# Patient Record
Sex: Female | Born: 1970 | Race: White | Hispanic: No | Marital: Single | Smoking: Never smoker
Health system: Southern US, Community
[De-identification: ages and names within clinical notes are randomized; demographics above are authoritative.]

## PROBLEM LIST (undated history)

## (undated) DIAGNOSIS — M67439 Ganglion, unspecified wrist: Secondary | ICD-10-CM

## (undated) DIAGNOSIS — M1712 Unilateral primary osteoarthritis, left knee: Secondary | ICD-10-CM

## (undated) DIAGNOSIS — N200 Calculus of kidney: Secondary | ICD-10-CM

## (undated) DIAGNOSIS — M7711 Lateral epicondylitis, right elbow: Secondary | ICD-10-CM

## (undated) DIAGNOSIS — Z9889 Other specified postprocedural states: Secondary | ICD-10-CM

## (undated) DIAGNOSIS — M1990 Unspecified osteoarthritis, unspecified site: Secondary | ICD-10-CM

## (undated) DIAGNOSIS — M25519 Pain in unspecified shoulder: Secondary | ICD-10-CM

## (undated) HISTORY — PX: KNEE ARTHROSCOPY: SUR90

## (undated) HISTORY — DX: Calculus of kidney: N20.0

## (undated) HISTORY — PX: JOINT REPLACEMENT: SHX530

## (undated) HISTORY — DX: Unilateral primary osteoarthritis, left knee: M17.12

---

## 2000-12-29 ENCOUNTER — Other Ambulatory Visit: Admission: RE | Admit: 2000-12-29 | Discharge: 2000-12-29 | Payer: Self-pay | Admitting: *Deleted

## 2002-02-25 ENCOUNTER — Other Ambulatory Visit: Admission: RE | Admit: 2002-02-25 | Discharge: 2002-02-25 | Payer: Self-pay | Admitting: Family Medicine

## 2002-04-13 ENCOUNTER — Encounter: Payer: Self-pay | Admitting: Family Medicine

## 2002-04-13 ENCOUNTER — Encounter: Admission: RE | Admit: 2002-04-13 | Discharge: 2002-04-13 | Payer: Self-pay | Admitting: Family Medicine

## 2004-02-21 ENCOUNTER — Other Ambulatory Visit: Admission: RE | Admit: 2004-02-21 | Discharge: 2004-02-21 | Payer: Self-pay | Admitting: Obstetrics and Gynecology

## 2010-09-14 ENCOUNTER — Other Ambulatory Visit: Admission: RE | Admit: 2010-09-14 | Discharge: 2010-09-14 | Payer: Self-pay | Admitting: Family Medicine

## 2010-09-28 ENCOUNTER — Encounter: Admission: RE | Admit: 2010-09-28 | Discharge: 2010-09-28 | Payer: Self-pay | Admitting: Family Medicine

## 2010-10-12 ENCOUNTER — Encounter
Admission: RE | Admit: 2010-10-12 | Discharge: 2010-10-12 | Payer: Self-pay | Source: Home / Self Care | Attending: Family Medicine | Admitting: Family Medicine

## 2010-11-18 ENCOUNTER — Encounter: Payer: Self-pay | Admitting: Family Medicine

## 2011-10-01 ENCOUNTER — Other Ambulatory Visit: Payer: Self-pay | Admitting: Family Medicine

## 2011-10-01 DIAGNOSIS — Z1231 Encounter for screening mammogram for malignant neoplasm of breast: Secondary | ICD-10-CM

## 2011-10-10 ENCOUNTER — Ambulatory Visit
Admission: RE | Admit: 2011-10-10 | Discharge: 2011-10-10 | Disposition: A | Discharge status: Home / Self Care | Payer: BC Managed Care – PPO | Source: Ambulatory Visit | Attending: Family Medicine | Admitting: Family Medicine

## 2011-10-10 DIAGNOSIS — Z1231 Encounter for screening mammogram for malignant neoplasm of breast: Secondary | ICD-10-CM

## 2012-08-25 ENCOUNTER — Encounter (HOSPITAL_BASED_OUTPATIENT_CLINIC_OR_DEPARTMENT_OTHER): Payer: Self-pay | Admitting: *Deleted

## 2012-08-25 ENCOUNTER — Other Ambulatory Visit: Payer: Self-pay | Admitting: Orthopedic Surgery

## 2012-08-26 NOTE — H&P (Signed)
  Rebecca Morrow is an 41 y.o. female.   Chief Complaint: c/o mass dorsum left wrist HPI: Rebecca Morrow presented for evaluation of a left dorsal distal forearm cyst overlying the extensor carpi ulnaris. This has been present for two years.  It has gradually increased in size.  She has had prior injections without relief.  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  She has no antecedent history of injury in this region.  She has no history of rheumatoid arthritis or other significant medical impairments.      Past Medical History  Diagnosis Date  . Arthritis     lt knee  . Ganglion cyst of wrist     LT    Past Surgical History  Procedure Date  . Knee arthroscopy     LT knee sx x6    History reviewed. No pertinent family history. Social History:  reports that she has never smoked. She has never used smokeless tobacco. She reports that she drinks alcohol. She reports that she does not use illicit drugs.  Allergies: No Known Allergies  No prescriptions prior to admission    No results found for this or any previous visit (from the past 48 hour(s)).  No results found.   Pertinent items are noted in HPI.  Height 5\' 9"  (1.753 m), weight 73.483 kg (162 lb), last menstrual period 08/06/2012.  General appearance: alert Head: Normocephalic, without obvious abnormality Neck: supple, symmetrical, trachea midline Resp: clear to auscultation bilaterally Cardio: regular rate and rhythm GI: normal findings: bowel sounds normal Extremities: She has a 1  cm. myxoid cyst over the extensor carpi ulnaris on the dorsal aspect of her left distal forearm. She has full range of motion of her fingers in flexion and extension.  She has no sign of motor or sensory impairment.    Plain films of her wrist three views demonstrate soft tissue swelling, no bony erosion or significant arthrosis. She is ulnar neutral.   Pulses: 2+ and  symmetric Skin: normal Neurologic: Grossly normal    Assessment/Plan Impression::   Myxoid cyst left dorsal distal forearm.   PLAN:  We will schedule excision under general anesthesia at a mutually convenient time.  The surgery and aftercare were described in detail.  Questions were invited and answered.     DASNOIT,Rhiley Tarver J 08/26/2012, 3:55 PM     H&P documentation: 08/27/2012  -History and Physical Reviewed  -Patient has been re-examined  -No change in the plan of care  Wyn Forster, MD

## 2012-08-27 ENCOUNTER — Encounter (HOSPITAL_BASED_OUTPATIENT_CLINIC_OR_DEPARTMENT_OTHER): Payer: Self-pay | Admitting: *Deleted

## 2012-08-27 ENCOUNTER — Encounter (HOSPITAL_BASED_OUTPATIENT_CLINIC_OR_DEPARTMENT_OTHER): Payer: Self-pay | Admitting: Certified Registered"

## 2012-08-27 ENCOUNTER — Ambulatory Visit (HOSPITAL_BASED_OUTPATIENT_CLINIC_OR_DEPARTMENT_OTHER)
Admission: RE | Admit: 2012-08-27 | Discharge: 2012-08-27 | Disposition: A | Discharge status: Home / Self Care | Payer: BC Managed Care – PPO | Source: Ambulatory Visit | Attending: Orthopedic Surgery | Admitting: Orthopedic Surgery

## 2012-08-27 ENCOUNTER — Encounter (HOSPITAL_BASED_OUTPATIENT_CLINIC_OR_DEPARTMENT_OTHER)
Admission: RE | Disposition: A | Discharge status: Home / Self Care | Payer: Self-pay | Source: Ambulatory Visit | Attending: Orthopedic Surgery

## 2012-08-27 ENCOUNTER — Ambulatory Visit (HOSPITAL_BASED_OUTPATIENT_CLINIC_OR_DEPARTMENT_OTHER): Payer: BC Managed Care – PPO | Admitting: Certified Registered"

## 2012-08-27 DIAGNOSIS — M1710 Unilateral primary osteoarthritis, unspecified knee: Secondary | ICD-10-CM | POA: Insufficient documentation

## 2012-08-27 DIAGNOSIS — Z79899 Other long term (current) drug therapy: Secondary | ICD-10-CM | POA: Insufficient documentation

## 2012-08-27 DIAGNOSIS — M6740 Ganglion, unspecified site: Secondary | ICD-10-CM | POA: Insufficient documentation

## 2012-08-27 HISTORY — DX: Ganglion, unspecified wrist: M67.439

## 2012-08-27 HISTORY — PX: GANGLION CYST EXCISION: SHX1691

## 2012-08-27 HISTORY — DX: Unspecified osteoarthritis, unspecified site: M19.90

## 2012-08-27 LAB — POCT HEMOGLOBIN-HEMACUE: Hemoglobin: 13.6 g/dL (ref 12.0–15.0)

## 2012-08-27 SURGERY — EXCISION, GANGLION CYST, WRIST
Anesthesia: General | Site: Wrist | Laterality: Left | Wound class: Clean

## 2012-08-27 MED ORDER — LACTATED RINGERS IV SOLN
INTRAVENOUS | Status: DC
  Administered 2012-08-27: 07:00:00 via INTRAVENOUS
  Administered 2012-08-27: 20 mL/h via INTRAVENOUS

## 2012-08-27 MED ORDER — ONDANSETRON HCL 4 MG/2ML IJ SOLN
INTRAMUSCULAR | Status: DC | PRN
  Administered 2012-08-27: 4 mg via INTRAVENOUS

## 2012-08-27 MED ORDER — HYDROMORPHONE HCL PF 1 MG/ML IJ SOLN
0.2500 mg | INTRAMUSCULAR | Status: DC | PRN
  Administered 2012-08-27: 0.5 mg via INTRAVENOUS

## 2012-08-27 MED ORDER — PROPOFOL 10 MG/ML IV BOLUS
INTRAVENOUS | Status: DC | PRN
  Administered 2012-08-27: 30 mg via INTRAVENOUS
  Administered 2012-08-27: 200 mg via INTRAVENOUS

## 2012-08-27 MED ORDER — HYDROCODONE-ACETAMINOPHEN 5-325 MG PO TABS: ORAL_TABLET | ORAL | Status: DC

## 2012-08-27 MED ORDER — LIDOCAINE HCL (CARDIAC) 20 MG/ML IV SOLN
INTRAVENOUS | Status: DC | PRN
  Administered 2012-08-27: 40 mg via INTRAVENOUS

## 2012-08-27 MED ORDER — CHLORHEXIDINE GLUCONATE 4 % EX LIQD
60.0000 mL | Freq: Once | CUTANEOUS | Status: AC
  Administered 2012-08-27: 4 via TOPICAL

## 2012-08-27 MED ORDER — OXYCODONE HCL 5 MG PO TABS: 5.0000 mg | ORAL_TABLET | Freq: Once | ORAL | Status: DC | PRN

## 2012-08-27 MED ORDER — OXYCODONE HCL 5 MG/5ML PO SOLN: 5.0000 mg | Freq: Once | ORAL | Status: DC | PRN

## 2012-08-27 MED ORDER — FENTANYL CITRATE 0.05 MG/ML IJ SOLN
INTRAMUSCULAR | Status: DC | PRN
  Administered 2012-08-27 (×2): 50 ug via INTRAVENOUS

## 2012-08-27 MED ORDER — MIDAZOLAM HCL 5 MG/5ML IJ SOLN
INTRAMUSCULAR | Status: DC | PRN
  Administered 2012-08-27: 1 mg via INTRAVENOUS

## 2012-08-27 MED ORDER — DEXAMETHASONE SODIUM PHOSPHATE 4 MG/ML IJ SOLN
INTRAMUSCULAR | Status: DC | PRN
  Administered 2012-08-27: 10 mg via INTRAVENOUS

## 2012-08-27 MED ORDER — LIDOCAINE HCL 2 % IJ SOLN
INTRAMUSCULAR | Status: DC | PRN
  Administered 2012-08-27: 2 mL

## 2012-08-27 SURGICAL SUPPLY — 42 items
BANDAGE ADHESIVE 1X3 (GAUZE/BANDAGES/DRESSINGS) IMPLANT
BANDAGE ELASTIC 3 VELCRO ST LF (GAUZE/BANDAGES/DRESSINGS) ×2 IMPLANT
BANDAGE GAUZE ELAST BULKY 4 IN (GAUZE/BANDAGES/DRESSINGS) ×1 IMPLANT
BLADE MINI RND TIP GREEN BEAV (BLADE) IMPLANT
BLADE SURG 15 STRL LF DISP TIS (BLADE) ×1 IMPLANT
BLADE SURG 15 STRL SS (BLADE) ×2
BNDG CMPR 9X4 STRL LF SNTH (GAUZE/BANDAGES/DRESSINGS) ×1
BNDG ESMARK 4X9 LF (GAUZE/BANDAGES/DRESSINGS) ×1 IMPLANT
BRUSH SCRUB EZ PLAIN DRY (MISCELLANEOUS) ×2 IMPLANT
CLOTH BEACON ORANGE TIMEOUT ST (SAFETY) ×2 IMPLANT
CORDS BIPOLAR (ELECTRODE) ×2 IMPLANT
COVER MAYO STAND STRL (DRAPES) ×2 IMPLANT
COVER TABLE BACK 60X90 (DRAPES) ×2 IMPLANT
CUFF TOURNIQUET SINGLE 18IN (TOURNIQUET CUFF) ×1 IMPLANT
DECANTER SPIKE VIAL GLASS SM (MISCELLANEOUS) ×1 IMPLANT
DRAPE EXTREMITY T 121X128X90 (DRAPE) ×2 IMPLANT
DRAPE SURG 17X23 STRL (DRAPES) ×2 IMPLANT
GLOVE BIO SURGEON STRL SZ 6.5 (GLOVE) ×2 IMPLANT
GLOVE BIOGEL M STRL SZ7.5 (GLOVE) ×2 IMPLANT
GLOVE ORTHO TXT STRL SZ7.5 (GLOVE) ×2 IMPLANT
GOWN PREVENTION PLUS XLARGE (GOWN DISPOSABLE) ×2 IMPLANT
GOWN PREVENTION PLUS XXLARGE (GOWN DISPOSABLE) ×4 IMPLANT
NEEDLE 27GAX1X1/2 (NEEDLE) ×1 IMPLANT
PACK BASIN DAY SURGERY FS (CUSTOM PROCEDURE TRAY) ×2 IMPLANT
PAD CAST 3X4 CTTN HI CHSV (CAST SUPPLIES) ×1 IMPLANT
PADDING CAST ABS 4INX4YD NS (CAST SUPPLIES) ×1
PADDING CAST ABS COTTON 4X4 ST (CAST SUPPLIES) ×1 IMPLANT
PADDING CAST COTTON 3X4 STRL (CAST SUPPLIES) ×2
SPLINT PLASTER CAST XFAST 3X15 (CAST SUPPLIES) ×4 IMPLANT
SPLINT PLASTER XTRA FASTSET 3X (CAST SUPPLIES) ×4
SPONGE GAUZE 4X4 12PLY (GAUZE/BANDAGES/DRESSINGS) ×2 IMPLANT
STOCKINETTE 4X48 STRL (DRAPES) ×2 IMPLANT
STRIP CLOSURE SKIN 1/2X4 (GAUZE/BANDAGES/DRESSINGS) ×1 IMPLANT
SUT PROLENE 3 0 PS 2 (SUTURE) ×2 IMPLANT
SUT VIC AB 4-0 P-3 18XBRD (SUTURE) ×1 IMPLANT
SUT VIC AB 4-0 P3 18 (SUTURE) ×2
SYR 3ML 23GX1 SAFETY (SYRINGE) IMPLANT
SYR CONTROL 10ML LL (SYRINGE) ×1 IMPLANT
TOWEL OR 17X24 6PK STRL BLUE (TOWEL DISPOSABLE) ×4 IMPLANT
TRAY DSU PREP LF (CUSTOM PROCEDURE TRAY) ×2 IMPLANT
UNDERPAD 30X30 INCONTINENT (UNDERPADS AND DIAPERS) ×2 IMPLANT
WATER STERILE IRR 1000ML POUR (IV SOLUTION) ×2 IMPLANT

## 2012-08-27 NOTE — Brief Op Note (Signed)
08/27/2012  8:19 AM  PATIENT:  Rebecca Morrow  41 y.o. female  PRE-OPERATIVE DIAGNOSIS:  LEFT DORSAL ULNAR FOREARM MYXOID CYST   POST-OPERATIVE DIAGNOSIS: LEFT DORSAL FOREARM MYXOID CYST  PROCEDURE:  EXCISION OF COMPLEX MYXOID CYST OF LEFT DORSAL ULNAR FOREARM  SURGEON:  Surgeon(s) and Role:    * Wyn Forster., MD - Primary  PHYSICIAN ASSISTANT:   ASSISTANTS:Aparna Vanderweele Dasnoit,P.A-C   ANESTHESIA:   general  EBL:  Total I/O In: 700 [I.V.:700] Out: -   BLOOD ADMINISTERED:none  DRAINS: none   LOCAL MEDICATIONS USED:  LIDOCAINE   SPECIMEN:  No Specimen  DISPOSITION OF SPECIMEN:  N/A  COUNTS:  YES  TOURNIQUET:  * Missing tourniquet times found for documented tourniquets in log:  14782 *  DICTATION: .Other Dictation: Dictation Number [id number]  PLAN OF CARE: Discharge to home after PACU  PATIENT DISPOSITION:  PACU - hemodynamically stable.

## 2012-08-27 NOTE — Anesthesia Preprocedure Evaluation (Signed)
Anesthesia Evaluation  Patient identified by MRN, date of birth, ID band Patient awake    Reviewed: Allergy & Precautions, H&P , NPO status , Patient's Chart, lab work & pertinent test results  Airway Mallampati: I TM Distance: >3 FB Neck ROM: Full    Dental No notable dental hx. (+) Teeth Intact and Dental Advisory Given   Pulmonary neg pulmonary ROS,  breath sounds clear to auscultation  Pulmonary exam normal       Cardiovascular negative cardio ROS  Rhythm:Regular Rate:Normal     Neuro/Psych negative neurological ROS  negative psych ROS   GI/Hepatic negative GI ROS, Neg liver ROS,   Endo/Other  negative endocrine ROS  Renal/GU negative Renal ROS  negative genitourinary   Musculoskeletal   Abdominal   Peds  Hematology negative hematology ROS (+)   Anesthesia Other Findings   Reproductive/Obstetrics negative OB ROS                           Anesthesia Physical Anesthesia Plan  ASA: I  Anesthesia Plan: General   Post-op Pain Management:    Induction: Intravenous  Airway Management Planned: LMA  Additional Equipment:   Intra-op Plan:   Post-operative Plan: Extubation in OR  Informed Consent: I have reviewed the patients History and Physical, chart, labs and discussed the procedure including the risks, benefits and alternatives for the proposed anesthesia with the patient or authorized representative who has indicated his/her understanding and acceptance.   Dental advisory given  Plan Discussed with: CRNA  Anesthesia Plan Comments:         Anesthesia Quick Evaluation  

## 2012-08-27 NOTE — Anesthesia Procedure Notes (Signed)
Procedure Name: LMA Insertion Date/Time: 08/27/2012 7:54 AM Performed by: Verlan Friends Pre-anesthesia Checklist: Patient identified, Emergency Drugs available, Suction available, Patient being monitored and Timeout performed Patient Re-evaluated:Patient Re-evaluated prior to inductionOxygen Delivery Method: Circle System Utilized Preoxygenation: Pre-oxygenation with 100% oxygen Intubation Type: IV induction Ventilation: Mask ventilation without difficulty LMA: LMA inserted LMA Size: 4.0 Number of attempts: 1 Airway Equipment and Method: bite block Placement Confirmation: positive ETCO2 Tube secured with: Tape Dental Injury: Teeth and Oropharynx as per pre-operative assessment

## 2012-08-27 NOTE — Op Note (Signed)
[id number] 

## 2012-08-27 NOTE — Transfer of Care (Signed)
Immediate Anesthesia Transfer of Care Note  Patient: Rebecca Morrow  Procedure(s) Performed: Procedure(s) (LRB) with comments: REMOVAL GANGLION OF WRIST (Left) - EXCISION BIOPSY LEFT WRIST DORSAL GANGLION CYST  Patient Location: PACU  Anesthesia Type:General  Level of Consciousness: sedated and unresponsive  Airway & Oxygen Therapy: Patient Spontanous Breathing and Patient connected to face mask oxygen  Post-op Assessment: Report given to PACU RN and Post -op Vital signs reviewed and stable  Post vital signs: Reviewed and stable  Complications: No apparent anesthesia complications

## 2012-08-27 NOTE — Anesthesia Postprocedure Evaluation (Signed)
  Anesthesia Post-op Note  Patient: Rebecca Morrow  Procedure(s) Performed: Procedure(s) (LRB) with comments: REMOVAL GANGLION OF WRIST (Left) - EXCISION BIOPSY LEFT WRIST DORSAL GANGLION CYST  Patient Location: PACU  Anesthesia Type:General  Level of Consciousness: awake, alert  and oriented  Airway and Oxygen Therapy: Patient Spontanous Breathing and Patient connected to face mask oxygen  Post-op Pain: mild  Post-op Assessment: Post-op Vital signs reviewed, Patient's Cardiovascular Status Stable, Respiratory Function Stable, Patent Airway and No signs of Nausea or vomiting  Post-op Vital Signs: Reviewed and stable  Complications: No apparent anesthesia complications

## 2012-08-28 ENCOUNTER — Encounter (HOSPITAL_BASED_OUTPATIENT_CLINIC_OR_DEPARTMENT_OTHER): Payer: Self-pay | Admitting: Orthopedic Surgery

## 2012-08-28 NOTE — Op Note (Signed)
NAMEAVIS, BLOMGREN                  ACCOUNT NO.:  [id number]  MEDICAL RECORD NO.:  [id number]  LOCATION:                                 FACILITY:  PHYSICIAN:  Katy Fitch. Jeorge Reister, M.D. DATE OF BIRTH:  03/21/1971  DATE OF PROCEDURE:  08/27/2012 DATE OF DISCHARGE:                              OPERATIVE REPORT   PREOPERATIVE DIAGNOSIS:  Complex myxoid cyst, dorsal aspect of left distal forearm overlying extensor carpi ulnaris and ulnar head.  POSTOPERATIVE DIAGNOSIS:  Complex myxoid cyst likely originating at triangular fibrocartilage.  OPERATION:  Resection of complex myxoid, left distal dorsal/ulnar forearm.  OPERATING SURGEON:  Katy Fitch. Arthuro Canelo, MD  ASSISTANT:  Marveen Reeks Dasnoit, PA-C  ANESTHESIA:  General by LMA.  SUPERVISING ANESTHESIOLOGIST:  Zenon Mayo, MD  INDICATIONS:  [facility]) Rebecca Morrow is a 41 year old woman who is referred for evaluation and management of a multilobular cyst on the dorsal ulnar aspect of her left forearm just proximal to the ulnar head.  Clinical examination revealed a likely myxoid cyst originating on the extensor retinaculum possibly from the distal radioulnar joint.  Plain films of her wrist were nondiagnostic.  We advised exploration and excision.  We explained that these can recur particularly if she has internal derangement of her triangular fibrocartilage or ulnocarpal articulation.  We recommended an initial trial of simple excision.  She requested general anesthesia.  Prior to surgery, she was interviewed by Dr. Sampson Goon of anesthesia. After detailed informed consent, she requested general anesthesia by LMA technique.  She was transferred to room 6 of [facility] where under Dr. Jarrett Ables direct supervision, general anesthesia by LMA technique was induced.  The left arm and hand were prepped with Betadine soap and solution, sterilely draped.  A pneumatic tourniquet was applied to the proximal left brachium  set at 220 mmHg.  The left hand and arm were then prepped with Betadine soap and solution, sterilely draped.  Following exsanguination of the left arm with Esmarch bandage, the arterial tourniquet was inflated to 220 mmHg.  Procedure commenced with a short longitudinal incision directly over the mass.  Subcutaneous tissues were carefully divided taking care to identify and spare ulnar sensory branches.  The cyst was multilobular with several veins crossing.  These were taken down with electrocautery. The extensor retinaculum was released followed by circumferential dissection of the cyst and ultimate removal by stripping with a rongeur and drainage of its contents.  We found a area of mucin leak from the region of the distal radioulnar joint just distal to the ulnar head.  We followed this down to the triquetral triangular fibrocartilage articulation and used a small rongeur and electrocautery to excise and seal the neck of the cyst.  The wound was inspected for bleeding points, which were electrocauterized with bipolar current followed by repair of the retinaculum, pants-over-vest style with 4-0 Vicryl mattress sutures, followed by repair of the skin with subcutaneous 4-0 Vicryl and intradermal 3-0 Prolene.  Steri-Strips were applied followed by anesthesia of 2% lidocaine.  Ms. Leaman was placed in compressive dressing with a volar plaster splint. She was awakened from general anesthesia and transferred to the recovery room with  stable vital signs.  For aftercare, she will be discharged with a prescription for Vicodin 5 mg 1 or 2 tablets p.o. q.4-6 hours p.r.n. pain, 20 tablets without refill.     Katy Fitch Bonnetta Allbee, M.D.     RVS/MEDQ  D:  08/27/2012  T:  08/27/2012  Job:  [id number]

## 2012-08-31 ENCOUNTER — Other Ambulatory Visit: Payer: Self-pay | Admitting: Family Medicine

## 2012-08-31 DIAGNOSIS — Z1231 Encounter for screening mammogram for malignant neoplasm of breast: Secondary | ICD-10-CM

## 2012-10-12 ENCOUNTER — Ambulatory Visit: Payer: BC Managed Care – PPO

## 2012-10-20 ENCOUNTER — Ambulatory Visit
Admission: RE | Admit: 2012-10-20 | Discharge: 2012-10-20 | Disposition: A | Discharge status: Home / Self Care | Payer: BC Managed Care – PPO | Source: Ambulatory Visit | Attending: Family Medicine | Admitting: Family Medicine

## 2012-10-20 DIAGNOSIS — Z1231 Encounter for screening mammogram for malignant neoplasm of breast: Secondary | ICD-10-CM

## 2013-03-17 ENCOUNTER — Encounter: Payer: Self-pay | Admitting: Physician Assistant

## 2013-03-17 ENCOUNTER — Other Ambulatory Visit: Payer: Self-pay | Admitting: Physician Assistant

## 2013-03-17 NOTE — H&P (Signed)
TOTAL KNEE ADMISSION H&P  Patient is being admitted for left total knee arthroplasty.  Subjective:  Chief Complaint:left knee pain.  HPI: Rebecca Morrow, 42 y.o. female, has a history of pain and functional disability in the left knee due to trauma and has failed non-surgical conservative treatments for greater than 12 weeks to includeNSAID's and/or analgesics, corticosteriod injections, viscosupplementation injections, flexibility and strengthening excercises, supervised PT with diminished ADL's post treatment, weight reduction as appropriate and activity modification.  Onset of symptoms was gradual, starting >10 years ago with gradually worsening course since that time. The patient noted prior procedures on the knee to include  arthroscopy, menisectomy and ACL reconstruction on the left knee(s).  Patient currently rates pain in the left knee(s) at 10 out of 10 with activity. Patient has night pain, worsening of pain with activity and weight bearing, pain that interferes with activities of daily living, crepitus and joint swelling.  Patient has evidence of subchondral sclerosis, periarticular osteophytes, joint space narrowing and retained titanium ACL screw in femur by imaging studies. There is no active infection.  There are no active problems to display for this patient.  Past Medical History  Diagnosis Date  . Arthritis     lt knee  . Ganglion cyst of wrist     LT  . Left knee DJD     Past Surgical History  Procedure Laterality Date  . Knee arthroscopy      LT knee sx x6  . Ganglion cyst excision  08/27/2012    Procedure: REMOVAL GANGLION OF WRIST;  Surgeon: Wyn Forster., MD;  Location: [facility];  Service: Orthopedics;  Laterality: Left;  EXCISION BIOPSY LEFT WRIST DORSAL GANGLION CYST     (Not in a hospital admission) No Known Allergies  History  Substance Use Topics  . Smoking status: Never Smoker   . Smokeless tobacco: Never Used  . Alcohol Use: Yes    Comment: social    Current Outpatient Prescriptions on File Prior to Visit  Medication Sig Dispense Refill  . HYDROcodone-acetaminophen (NORCO/VICODIN) 5-325 MG per tablet 1 or 2 tabs every 4 hours as needed for pain  20 tablet  0   No current facility-administered medications on file prior to visit.   Meds ordered this encounter  Medications  . celecoxib (CELEBREX) 200 MG capsule    Sig: Take 200 mg by mouth 2 (two) times daily.    Family History  Problem Relation Age of Onset  . Heart attack Father 56  . Hypertension Father   . Diabetes Mellitus II Mother      Review of Systems  Constitutional: Negative.   HENT: Negative.   Eyes: Negative.   Respiratory: Negative.   Cardiovascular: Negative.   Gastrointestinal: Negative.   Genitourinary: Negative.   Musculoskeletal: Positive for joint pain.  Skin: Negative.   Neurological: Negative.   Endo/Heme/Allergies: Negative.   Psychiatric/Behavioral: Negative.     Objective:  Physical Exam  Constitutional: She is oriented to person, place, and time. She appears well-developed and well-nourished.  HENT:  Head: Normocephalic and atraumatic.  Eyes: EOM are normal. Pupils are equal, round, and reactive to light.  Neck: Normal range of motion.  Cardiovascular: Normal rate, regular rhythm and normal heart sounds.   No murmur heard. Respiratory: Effort normal and breath sounds normal.  GI: Soft. Bowel sounds are normal.  Genitourinary:  Not pertinent to current symptomatology therefore not examined.  Musculoskeletal:  Examination of her left knee reveals previous well healed  incisions.  1+ effusion.  Diffuse pain.  Range of motion -5 to 125 degrees.  Knee is stable with normal patella tracking.  Examination of the right knee reveals full range of motion pain, swelling, weakness or instability.  Vascular exam: Pulses are 2+ and symmetric.    Neurological: She is alert and oriented to person, place, and time.  Skin: Skin is warm  and dry.  Psychiatric: She has a normal mood and affect. Her behavior is normal.    Vital signs in last 24 hours: Last recorded: 05/21 1500   BP: 104/64 Pulse: 67  Temp: 98.3 F (36.8 C)    Height: 5\' 8"  (1.727 m) SpO2: 97  Weight: 74.844 kg (165 lb)     Labs:   Estimated body mass index is 25.09 kg/(m^2) as calculated from the following:   Height as of this encounter: 5\' 8"  (1.727 m).   Weight as of this encounter: 74.844 kg (165 lb).   Imaging Review Plain radiographs demonstrate severe degenerative joint disease of the left knee(s). The overall alignment ismild valgus. The bone quality appears to be good for age and reported activity level.  Assessment/Plan:  End stage arthritis, left knee   The patient history, physical examination, clinical judgment of the provider and imaging studies are consistent with end stage degenerative joint disease of the left knee(s) and total knee arthroplasty is deemed medically necessary. The treatment options including medical management, injection therapy arthroscopy and arthroplasty were discussed at length. The risks and benefits of total knee arthroplasty were presented and reviewed. The risks due to aseptic loosening, infection, stiffness, patella tracking problems, thromboembolic complications and other imponderables were discussed. The patient acknowledged the explanation, agreed to proceed with the plan and consent was signed. Patient is being admitted for inpatient treatment for surgery, pain control, PT, OT, prophylactic antibiotics, VTE prophylaxis, progressive ambulation and ADL's and discharge planning. The patient is planning to be discharged home with home health services  Lavella Myren A. Gwinda Passe Physician Assistant [facility] [phone]  03/17/2013, 4:00 PM

## 2013-03-23 ENCOUNTER — Encounter (HOSPITAL_COMMUNITY): Payer: Self-pay | Admitting: Pharmacy Technician

## 2013-03-25 ENCOUNTER — Encounter (HOSPITAL_COMMUNITY)
Admission: RE | Admit: 2013-03-25 | Discharge: 2013-03-25 | Disposition: A | Discharge status: Home / Self Care | Payer: BC Managed Care – PPO | Source: Ambulatory Visit | Attending: Orthopedic Surgery | Admitting: Orthopedic Surgery

## 2013-03-25 ENCOUNTER — Ambulatory Visit (HOSPITAL_COMMUNITY)
Admission: RE | Admit: 2013-03-25 | Discharge: 2013-03-25 | Disposition: A | Discharge status: Home / Self Care | Payer: BC Managed Care – PPO | Source: Ambulatory Visit | Attending: Physician Assistant | Admitting: Physician Assistant

## 2013-03-25 ENCOUNTER — Encounter (HOSPITAL_COMMUNITY): Payer: Self-pay

## 2013-03-25 DIAGNOSIS — Z01818 Encounter for other preprocedural examination: Secondary | ICD-10-CM | POA: Insufficient documentation

## 2013-03-25 DIAGNOSIS — Z01812 Encounter for preprocedural laboratory examination: Secondary | ICD-10-CM | POA: Insufficient documentation

## 2013-03-25 LAB — URINALYSIS, ROUTINE W REFLEX MICROSCOPIC
Glucose, UA: NEGATIVE mg/dL
Leukocytes, UA: NEGATIVE
Nitrite: NEGATIVE
Protein, ur: NEGATIVE mg/dL
pH: 6.5 (ref 5.0–8.0)

## 2013-03-25 LAB — COMPREHENSIVE METABOLIC PANEL
ALT: 11 U/L (ref 0–35)
AST: 21 U/L (ref 0–37)
CO2: 24 mEq/L (ref 19–32)
Chloride: 103 mEq/L (ref 96–112)
GFR calc non Af Amer: >90 mL/min (ref >90)
Potassium: 4.2 mEq/L (ref 3.5–5.1)
Sodium: 138 mEq/L (ref 135–145)
Total Bilirubin: 0.4 mg/dL (ref 0.3–1.2)

## 2013-03-25 LAB — URINE MICROSCOPIC-ADD ON

## 2013-03-25 LAB — CBC WITH DIFFERENTIAL/PLATELET
Basophils Absolute: 0 10*3/uL (ref 0.0–0.1)
HCT: 42.4 % (ref 36.0–46.0)
Lymphocytes Relative: 35 % (ref 12–46)
Neutro Abs: 2.7 10*3/uL (ref 1.7–7.7)
Platelets: 245 10*3/uL (ref 150–400)
RDW: 12.9 % (ref 11.5–15.5)
WBC: 5 10*3/uL (ref 4.0–10.5)

## 2013-03-25 LAB — SURGICAL PCR SCREEN: Staphylococcus aureus: NEGATIVE

## 2013-03-25 LAB — APTT: aPTT: 32 seconds (ref 24–37)

## 2013-03-25 MED ORDER — POVIDONE-IODINE 7.5 % EX SOLN: Freq: Once | CUTANEOUS | Status: DC

## 2013-03-25 MED ORDER — CHLORHEXIDINE GLUCONATE 4 % EX LIQD: 60.0000 mL | Freq: Once | CUTANEOUS | Status: DC

## 2013-03-25 NOTE — Progress Notes (Signed)
Dr Ihor Dow called for ekg

## 2013-03-25 NOTE — Pre-Procedure Instructions (Signed)
KRYSTALL KRUCKENBERG  03/25/2013   Your procedure is scheduled on:  03/29/13  Report to [facility] at 630 AM.  Call this number if you have problems the morning of surgery: [phone]   Remember:   Do not eat food or drink liquids after midnight.   Take these medicines the morning of surgery with A SIP OF WATER: celebrex   Do not wear jewelry, make-up or nail polish.  Do not wear lotions, powders, or perfumes. You may wear deodorant.  Do not shave 48 hours prior to surgery. Men may shave face and neck.  Do not bring valuables to the hospital.  Contacts, dentures or bridgework may not be worn into surgery.  Leave suitcase in the car. After surgery it may be brought to your room.  For patients admitted to the hospital, checkout time is 11:00 AM the day of  discharge.   Patients discharged the day of surgery will not be allowed to drive  home.  Name and phone number of your driver: family  Special Instructions: Shower using CHG 2 nights before surgery and the night before surgery.  If you shower the day of surgery use CHG.  Use special wash - you have one bottle of CHG for all showers.  You should use approximately 1/3 of the bottle for each shower.   Please read over the following fact sheets that you were given: Pain Booklet, Coughing and Deep Breathing, Blood Transfusion Information, MRSA Information and Surgical Site Infection Prevention

## 2013-03-27 LAB — URINE CULTURE: Colony Count: NO GROWTH

## 2013-03-28 MED ORDER — CEFAZOLIN SODIUM-DEXTROSE 2-3 GM-% IV SOLR
2.0000 g | INTRAVENOUS | Status: AC
  Administered 2013-03-29: 2 g via INTRAVENOUS
  Filled 2013-03-28: qty 50

## 2013-03-29 ENCOUNTER — Encounter (HOSPITAL_COMMUNITY)
Admission: RE | Disposition: A | Discharge status: Home / Self Care | Payer: Self-pay | Source: Ambulatory Visit | Attending: Orthopedic Surgery

## 2013-03-29 ENCOUNTER — Inpatient Hospital Stay (HOSPITAL_COMMUNITY)
Admission: RE | Admit: 2013-03-29 | Discharge: 2013-03-30 | DRG: 209 | Disposition: A | Discharge status: Home / Self Care | Payer: BC Managed Care – PPO | Source: Ambulatory Visit | Attending: Orthopedic Surgery | Admitting: Orthopedic Surgery

## 2013-03-29 ENCOUNTER — Ambulatory Visit (HOSPITAL_COMMUNITY): Payer: BC Managed Care – PPO | Admitting: Anesthesiology

## 2013-03-29 ENCOUNTER — Encounter (HOSPITAL_COMMUNITY): Payer: Self-pay | Admitting: Anesthesiology

## 2013-03-29 DIAGNOSIS — M1710 Unilateral primary osteoarthritis, unspecified knee: Principal | ICD-10-CM | POA: Diagnosis present

## 2013-03-29 DIAGNOSIS — Z96659 Presence of unspecified artificial knee joint: Secondary | ICD-10-CM

## 2013-03-29 DIAGNOSIS — Z9889 Other specified postprocedural states: Secondary | ICD-10-CM

## 2013-03-29 DIAGNOSIS — Z969 Presence of functional implant, unspecified: Secondary | ICD-10-CM

## 2013-03-29 DIAGNOSIS — Z7982 Long term (current) use of aspirin: Secondary | ICD-10-CM

## 2013-03-29 DIAGNOSIS — M1712 Unilateral primary osteoarthritis, left knee: Secondary | ICD-10-CM | POA: Diagnosis present

## 2013-03-29 HISTORY — PX: TOTAL KNEE ARTHROPLASTY: SHX125

## 2013-03-29 LAB — URINALYSIS W MICROSCOPIC + REFLEX CULTURE
Glucose, UA: NEGATIVE mg/dL
Leukocytes, UA: NEGATIVE
Specific Gravity, Urine: 1.026 (ref 1.005–1.030)
pH: 5.5 (ref 5.0–8.0)

## 2013-03-29 SURGERY — ARTHROPLASTY, KNEE, TOTAL
Anesthesia: Regional | Site: Knee | Laterality: Left | Wound class: Clean

## 2013-03-29 MED ORDER — ALUM & MAG HYDROXIDE-SIMETH 200-200-20 MG/5ML PO SUSP
30.0000 mL | ORAL | Status: DC | PRN
  Administered 2013-03-29: 30 mL via ORAL
  Filled 2013-03-29: qty 30

## 2013-03-29 MED ORDER — ONDANSETRON HCL 4 MG/2ML IJ SOLN: 4.0000 mg | Freq: Once | INTRAMUSCULAR | Status: DC | PRN

## 2013-03-29 MED ORDER — FENTANYL CITRATE 0.05 MG/ML IJ SOLN
INTRAMUSCULAR | Status: DC | PRN
  Administered 2013-03-29: 50 ug via INTRAVENOUS
  Administered 2013-03-29: 100 ug via INTRAVENOUS
  Administered 2013-03-29 (×3): 50 ug via INTRAVENOUS
  Administered 2013-03-29: 100 ug via INTRAVENOUS
  Administered 2013-03-29 (×2): 50 ug via INTRAVENOUS

## 2013-03-29 MED ORDER — ONDANSETRON HCL 4 MG PO TABS: 4.0000 mg | ORAL_TABLET | Freq: Four times a day (QID) | ORAL | Status: DC | PRN

## 2013-03-29 MED ORDER — CEFUROXIME SODIUM 1.5 G IJ SOLR
INTRAMUSCULAR | Status: AC
  Filled 2013-03-29: qty 1.5

## 2013-03-29 MED ORDER — ACETAMINOPHEN 10 MG/ML IV SOLN
INTRAVENOUS | Status: AC
  Filled 2013-03-29: qty 100

## 2013-03-29 MED ORDER — HYDROMORPHONE HCL PF 1 MG/ML IJ SOLN
1.0000 mg | INTRAMUSCULAR | Status: DC | PRN
  Administered 2013-03-29: 1 mg via INTRAVENOUS
  Filled 2013-03-29: qty 1

## 2013-03-29 MED ORDER — DOCUSATE SODIUM 100 MG PO CAPS
100.0000 mg | ORAL_CAPSULE | Freq: Two times a day (BID) | ORAL | Status: DC
  Administered 2013-03-29 – 2013-03-30 (×3): 100 mg via ORAL
  Filled 2013-03-29 (×3): qty 1

## 2013-03-29 MED ORDER — DEXAMETHASONE 6 MG PO TABS
10.0000 mg | ORAL_TABLET | Freq: Three times a day (TID) | ORAL | Status: AC
  Administered 2013-03-29 – 2013-03-30 (×3): 10 mg via ORAL
  Filled 2013-03-29 (×3): qty 1

## 2013-03-29 MED ORDER — LACTATED RINGERS IV SOLN
INTRAVENOUS | Status: DC | PRN
  Administered 2013-03-29 (×3): via INTRAVENOUS

## 2013-03-29 MED ORDER — METOCLOPRAMIDE HCL 5 MG PO TABS: 5.0000 mg | ORAL_TABLET | Freq: Three times a day (TID) | ORAL | Status: DC | PRN

## 2013-03-29 MED ORDER — PHENOL 1.4 % MT LIQD: 1.0000 | OROMUCOSAL | Status: DC | PRN

## 2013-03-29 MED ORDER — OXYCODONE HCL 5 MG PO TABS
5.0000 mg | ORAL_TABLET | ORAL | Status: DC | PRN
  Administered 2013-03-29: 10 mg via ORAL
  Administered 2013-03-30: 5 mg via ORAL
  Filled 2013-03-29: qty 1
  Filled 2013-03-29: qty 2

## 2013-03-29 MED ORDER — BUPIVACAINE-EPINEPHRINE PF 0.25-1:200000 % IJ SOLN
INTRAMUSCULAR | Status: AC
  Filled 2013-03-29: qty 30

## 2013-03-29 MED ORDER — SODIUM CHLORIDE 0.9 % IR SOLN
Status: DC | PRN
  Administered 2013-03-29: 3000 mL
  Administered 2013-03-29: 1000 mL

## 2013-03-29 MED ORDER — HYDROMORPHONE HCL PF 1 MG/ML IJ SOLN
INTRAMUSCULAR | Status: AC
  Administered 2013-03-29: 0.5 mg via INTRAVENOUS
  Filled 2013-03-29: qty 1

## 2013-03-29 MED ORDER — DEXAMETHASONE SODIUM PHOSPHATE 10 MG/ML IJ SOLN
10.0000 mg | Freq: Three times a day (TID) | INTRAMUSCULAR | Status: AC
  Filled 2013-03-29 (×2): qty 1

## 2013-03-29 MED ORDER — ONDANSETRON HCL 4 MG/2ML IJ SOLN
4.0000 mg | Freq: Four times a day (QID) | INTRAMUSCULAR | Status: DC | PRN
  Administered 2013-03-29: 4 mg via INTRAVENOUS
  Filled 2013-03-29: qty 2

## 2013-03-29 MED ORDER — DIPHENHYDRAMINE HCL 12.5 MG/5ML PO ELIX: 12.5000 mg | ORAL_SOLUTION | ORAL | Status: DC | PRN

## 2013-03-29 MED ORDER — BUPIVACAINE-EPINEPHRINE 0.25% -1:200000 IJ SOLN
INTRAMUSCULAR | Status: DC | PRN
  Administered 2013-03-29: 30 mL

## 2013-03-29 MED ORDER — LACTATED RINGERS IV SOLN: INTRAVENOUS | Status: DC

## 2013-03-29 MED ORDER — ONDANSETRON HCL 4 MG/2ML IJ SOLN
INTRAMUSCULAR | Status: DC | PRN
  Administered 2013-03-29: 4 mg via INTRAVENOUS

## 2013-03-29 MED ORDER — METHOCARBAMOL 500 MG PO TABS
500.0000 mg | ORAL_TABLET | Freq: Four times a day (QID) | ORAL | Status: DC | PRN
  Administered 2013-03-29: 500 mg via ORAL
  Filled 2013-03-29: qty 1

## 2013-03-29 MED ORDER — ARTIFICIAL TEARS OP OINT
TOPICAL_OINTMENT | OPHTHALMIC | Status: DC | PRN
  Administered 2013-03-29: 1 via OPHTHALMIC

## 2013-03-29 MED ORDER — METHOCARBAMOL 100 MG/ML IJ SOLN
500.0000 mg | Freq: Four times a day (QID) | INTRAVENOUS | Status: DC | PRN
  Filled 2013-03-29: qty 5

## 2013-03-29 MED ORDER — BUPIVACAINE-EPINEPHRINE PF 0.5-1:200000 % IJ SOLN
INTRAMUSCULAR | Status: DC | PRN
  Administered 2013-03-29: 30 mL

## 2013-03-29 MED ORDER — HYDROMORPHONE HCL PF 1 MG/ML IJ SOLN
0.2500 mg | INTRAMUSCULAR | Status: DC | PRN
  Administered 2013-03-29 (×2): 0.5 mg via INTRAVENOUS

## 2013-03-29 MED ORDER — CEFAZOLIN SODIUM-DEXTROSE 2-3 GM-% IV SOLR
2.0000 g | Freq: Four times a day (QID) | INTRAVENOUS | Status: AC
  Administered 2013-03-29 (×2): 2 g via INTRAVENOUS
  Filled 2013-03-29 (×2): qty 50

## 2013-03-29 MED ORDER — ACETAMINOPHEN 325 MG PO TABS: 650.0000 mg | ORAL_TABLET | Freq: Four times a day (QID) | ORAL | Status: DC | PRN

## 2013-03-29 MED ORDER — OXYCODONE HCL 5 MG PO TABS: 5.0000 mg | ORAL_TABLET | Freq: Once | ORAL | Status: DC | PRN

## 2013-03-29 MED ORDER — OXYCODONE HCL 5 MG/5ML PO SOLN: 5.0000 mg | Freq: Once | ORAL | Status: DC | PRN

## 2013-03-29 MED ORDER — PROPOFOL 10 MG/ML IV BOLUS
INTRAVENOUS | Status: DC | PRN
  Administered 2013-03-29: 150 mg via INTRAVENOUS
  Administered 2013-03-29: 20 mg via INTRAVENOUS

## 2013-03-29 MED ORDER — LIDOCAINE HCL (CARDIAC) 20 MG/ML IV SOLN
INTRAVENOUS | Status: DC | PRN
  Administered 2013-03-29: 100 mg via INTRAVENOUS

## 2013-03-29 MED ORDER — ZOLPIDEM TARTRATE 5 MG PO TABS: 5.0000 mg | ORAL_TABLET | Freq: Every evening | ORAL | Status: DC | PRN

## 2013-03-29 MED ORDER — POTASSIUM CHLORIDE IN NACL 20-0.9 MEQ/L-% IV SOLN
INTRAVENOUS | Status: DC
  Administered 2013-03-29: 100 mL/h via INTRAVENOUS
  Administered 2013-03-30: 02:00:00 via INTRAVENOUS
  Filled 2013-03-29 (×4): qty 1000

## 2013-03-29 MED ORDER — TRANEXAMIC ACID 100 MG/ML IV SOLN
1000.0000 mg | INTRAVENOUS | Status: AC
  Administered 2013-03-29: 1000 mg via INTRAVENOUS
  Filled 2013-03-29: qty 10

## 2013-03-29 MED ORDER — DEXAMETHASONE SODIUM PHOSPHATE 10 MG/ML IJ SOLN
INTRAMUSCULAR | Status: DC | PRN
  Administered 2013-03-29: 10 mg via INTRAVENOUS

## 2013-03-29 MED ORDER — METOCLOPRAMIDE HCL 5 MG/ML IJ SOLN: 5.0000 mg | Freq: Three times a day (TID) | INTRAMUSCULAR | Status: DC | PRN

## 2013-03-29 MED ORDER — ASPIRIN EC 325 MG PO TBEC
325.0000 mg | DELAYED_RELEASE_TABLET | Freq: Every day | ORAL | Status: DC
  Administered 2013-03-30: 325 mg via ORAL
  Filled 2013-03-29 (×2): qty 1

## 2013-03-29 MED ORDER — MENTHOL 3 MG MT LOZG: 1.0000 | LOZENGE | OROMUCOSAL | Status: DC | PRN

## 2013-03-29 MED ORDER — CELECOXIB 200 MG PO CAPS
200.0000 mg | ORAL_CAPSULE | Freq: Two times a day (BID) | ORAL | Status: DC
  Administered 2013-03-29 – 2013-03-30 (×2): 200 mg via ORAL
  Filled 2013-03-29 (×4): qty 1

## 2013-03-29 MED ORDER — ACETAMINOPHEN 10 MG/ML IV SOLN
1000.0000 mg | Freq: Once | INTRAVENOUS | Status: AC
  Administered 2013-03-29: 1000 mg via INTRAVENOUS

## 2013-03-29 MED ORDER — ACETAMINOPHEN 650 MG RE SUPP: 650.0000 mg | Freq: Four times a day (QID) | RECTAL | Status: DC | PRN

## 2013-03-29 MED ORDER — MIDAZOLAM HCL 5 MG/5ML IJ SOLN
INTRAMUSCULAR | Status: DC | PRN
  Administered 2013-03-29: 2 mg via INTRAVENOUS

## 2013-03-29 MED ORDER — MEPERIDINE HCL 25 MG/ML IJ SOLN: 6.2500 mg | INTRAMUSCULAR | Status: DC | PRN

## 2013-03-29 MED ORDER — BISACODYL 5 MG PO TBEC
10.0000 mg | DELAYED_RELEASE_TABLET | Freq: Every day | ORAL | Status: DC
  Administered 2013-03-29: 10 mg via ORAL
  Filled 2013-03-29 (×2): qty 2

## 2013-03-29 MED ORDER — ACETAMINOPHEN 10 MG/ML IV SOLN
1000.0000 mg | Freq: Four times a day (QID) | INTRAVENOUS | Status: AC
  Administered 2013-03-29 – 2013-03-30 (×3): 1000 mg via INTRAVENOUS
  Filled 2013-03-29 (×3): qty 100

## 2013-03-29 SURGICAL SUPPLY — 66 items
BANDAGE ELASTIC 6 VELCRO ST LF (GAUZE/BANDAGES/DRESSINGS) ×1 IMPLANT
BANDAGE ESMARK 6X9 LF (GAUZE/BANDAGES/DRESSINGS) ×1 IMPLANT
BLADE SAGITTAL 25.0X1.19X90 (BLADE) ×2 IMPLANT
BLADE SAW SGTL 11.0X1.19X90.0M (BLADE) IMPLANT
BLADE SAW SGTL 13.0X1.19X90.0M (BLADE) ×2 IMPLANT
BLADE SURG 10 STRL SS (BLADE) ×4 IMPLANT
BNDG CMPR 9X6 STRL LF SNTH (GAUZE/BANDAGES/DRESSINGS) ×1
BNDG CMPR MED 15X6 ELC VLCR LF (GAUZE/BANDAGES/DRESSINGS) ×1
BNDG ELASTIC 6X15 VLCR STRL LF (GAUZE/BANDAGES/DRESSINGS) ×2 IMPLANT
BNDG ESMARK 6X9 LF (GAUZE/BANDAGES/DRESSINGS) ×2
BOWL SMART MIX CTS (DISPOSABLE) ×2 IMPLANT
CEMENT HV SMART SET (Cement) ×4 IMPLANT
CLOTH BEACON ORANGE TIMEOUT ST (SAFETY) ×2 IMPLANT
COVER SURGICAL LIGHT HANDLE (MISCELLANEOUS) ×2 IMPLANT
CUFF TOURNIQUET SINGLE 34IN LL (TOURNIQUET CUFF) ×2 IMPLANT
CUFF TOURNIQUET SINGLE 44IN (TOURNIQUET CUFF) IMPLANT
DRAPE EXTREMITY T 121X128X90 (DRAPE) ×2 IMPLANT
DRAPE INCISE IOBAN 66X45 STRL (DRAPES) ×2 IMPLANT
DRAPE PROXIMA HALF (DRAPES) ×2 IMPLANT
DRAPE U-SHAPE 47X51 STRL (DRAPES) ×2 IMPLANT
DRSG ADAPTIC 3X8 NADH LF (GAUZE/BANDAGES/DRESSINGS) ×2 IMPLANT
DRSG PAD ABDOMINAL 8X10 ST (GAUZE/BANDAGES/DRESSINGS) ×3 IMPLANT
DURAPREP 26ML APPLICATOR (WOUND CARE) ×2 IMPLANT
ELECT CAUTERY BLADE 6.4 (BLADE) ×2 IMPLANT
ELECT REM PT RETURN 9FT ADLT (ELECTROSURGICAL) ×2
ELECTRODE REM PT RTRN 9FT ADLT (ELECTROSURGICAL) ×1 IMPLANT
EVACUATOR 1/8 PVC DRAIN (DRAIN) IMPLANT
FACESHIELD LNG OPTICON STERILE (SAFETY) ×2 IMPLANT
GLOVE BIO SURGEON STRL SZ7 (GLOVE) ×2 IMPLANT
GLOVE BIOGEL PI IND STRL 7.0 (GLOVE) ×1 IMPLANT
GLOVE BIOGEL PI IND STRL 7.5 (GLOVE) ×1 IMPLANT
GLOVE BIOGEL PI INDICATOR 7.0 (GLOVE) ×1
GLOVE BIOGEL PI INDICATOR 7.5 (GLOVE) ×1
GLOVE SS BIOGEL STRL SZ 7.5 (GLOVE) ×1 IMPLANT
GLOVE SUPERSENSE BIOGEL SZ 7.5 (GLOVE) ×1
GOWN PREVENTION PLUS XLARGE (GOWN DISPOSABLE) ×4 IMPLANT
GOWN STRL NON-REIN LRG LVL3 (GOWN DISPOSABLE) ×4 IMPLANT
HANDPIECE INTERPULSE COAX TIP (DISPOSABLE) ×2
HOOD PEEL AWAY FACE SHEILD DIS (HOOD) ×4 IMPLANT
IMMOBILIZER KNEE 22 UNIV (SOFTGOODS) ×1 IMPLANT
KIT BASIN OR (CUSTOM PROCEDURE TRAY) ×2 IMPLANT
KIT ROOM TURNOVER OR (KITS) ×2 IMPLANT
MANIFOLD NEPTUNE II (INSTRUMENTS) ×2 IMPLANT
NS IRRIG 1000ML POUR BTL (IV SOLUTION) ×2 IMPLANT
PACK TOTAL JOINT (CUSTOM PROCEDURE TRAY) ×2 IMPLANT
PAD ARMBOARD 7.5X6 YLW CONV (MISCELLANEOUS) ×4 IMPLANT
PAD CAST 4YDX4 CTTN HI CHSV (CAST SUPPLIES) ×1 IMPLANT
PADDING CAST COTTON 4X4 STRL (CAST SUPPLIES) ×2
PADDING CAST COTTON 6X4 STRL (CAST SUPPLIES) ×2 IMPLANT
POSITIONER HEAD PRONE TRACH (MISCELLANEOUS) ×2 IMPLANT
RUBBERBAND STERILE (MISCELLANEOUS) ×2 IMPLANT
SET HNDPC FAN SPRY TIP SCT (DISPOSABLE) ×1 IMPLANT
SPONGE GAUZE 4X4 12PLY (GAUZE/BANDAGES/DRESSINGS) ×2 IMPLANT
STRIP CLOSURE SKIN 1/2X4 (GAUZE/BANDAGES/DRESSINGS) ×2 IMPLANT
SUCTION FRAZIER TIP 10 FR DISP (SUCTIONS) ×2 IMPLANT
SUT ETHIBOND NAB CT1 #1 30IN (SUTURE) ×4 IMPLANT
SUT MNCRL AB 3-0 PS2 18 (SUTURE) ×2 IMPLANT
SUT VIC AB 0 CT1 27 (SUTURE) ×4
SUT VIC AB 0 CT1 27XBRD ANBCTR (SUTURE) ×2 IMPLANT
SUT VIC AB 2-0 CT1 27 (SUTURE) ×4
SUT VIC AB 2-0 CT1 TAPERPNT 27 (SUTURE) ×2 IMPLANT
SYR 30ML SLIP (SYRINGE) ×2 IMPLANT
TOWEL OR 17X24 6PK STRL BLUE (TOWEL DISPOSABLE) ×2 IMPLANT
TOWEL OR 17X26 10 PK STRL BLUE (TOWEL DISPOSABLE) ×2 IMPLANT
TRAY FOLEY CATH 14FR (SET/KITS/TRAYS/PACK) ×2 IMPLANT
WATER STERILE IRR 1000ML POUR (IV SOLUTION) ×3 IMPLANT

## 2013-03-29 NOTE — H&P (View-Only) (Signed)
TOTAL KNEE ADMISSION H&P  Patient is being admitted for left total knee arthroplasty.  Subjective:  Chief Complaint:left knee pain.  HPI: Rebecca Morrow, 41 y.o. female, has a history of pain and functional disability in the left knee due to trauma and has failed non-surgical conservative treatments for greater than 12 weeks to includeNSAID's and/or analgesics, corticosteriod injections, viscosupplementation injections, flexibility and strengthening excercises, supervised PT with diminished ADL's post treatment, weight reduction as appropriate and activity modification.  Onset of symptoms was gradual, starting >10 years ago with gradually worsening course since that time. The patient noted prior procedures on the knee to include  arthroscopy, menisectomy and ACL reconstruction on the left knee(s).  Patient currently rates pain in the left knee(s) at 10 out of 10 with activity. Patient has night pain, worsening of pain with activity and weight bearing, pain that interferes with activities of daily living, crepitus and joint swelling.  Patient has evidence of subchondral sclerosis, periarticular osteophytes, joint space narrowing and retained titanium ACL screw in femur by imaging studies. There is no active infection.  There are no active problems to display for this patient.  Past Medical History  Diagnosis Date  . Arthritis     lt knee  . Ganglion cyst of wrist     LT  . Left knee DJD     Past Surgical History  Procedure Laterality Date  . Knee arthroscopy      LT knee sx x6  . Ganglion cyst excision  08/27/2012    Procedure: REMOVAL GANGLION OF WRIST;  Surgeon: Robert V Sypher Jr., MD;  Location: [facility];  Service: Orthopedics;  Laterality: Left;  EXCISION BIOPSY LEFT WRIST DORSAL GANGLION CYST     (Not in a hospital admission) No Known Allergies  History  Substance Use Topics  . Smoking status: Never Smoker   . Smokeless tobacco: Never Used  . Alcohol Use: Yes    Comment: social    Current Outpatient Prescriptions on File Prior to Visit  Medication Sig Dispense Refill  . HYDROcodone-acetaminophen (NORCO/VICODIN) 5-325 MG per tablet 1 or 2 tabs every 4 hours as needed for pain  20 tablet  0   No current facility-administered medications on file prior to visit.   Meds ordered this encounter  Medications  . celecoxib (CELEBREX) 200 MG capsule    Sig: Take 200 mg by mouth 2 (two) times daily.    Family History  Problem Relation Age of Onset  . Heart attack Father 65  . Hypertension Father   . Diabetes Mellitus II Mother      Review of Systems  Constitutional: Negative.   HENT: Negative.   Eyes: Negative.   Respiratory: Negative.   Cardiovascular: Negative.   Gastrointestinal: Negative.   Genitourinary: Negative.   Musculoskeletal: Positive for joint pain.  Skin: Negative.   Neurological: Negative.   Endo/Heme/Allergies: Negative.   Psychiatric/Behavioral: Negative.     Objective:  Physical Exam  Constitutional: She is oriented to person, place, and time. She appears well-developed and well-nourished.  HENT:  Head: Normocephalic and atraumatic.  Eyes: EOM are normal. Pupils are equal, round, and reactive to light.  Neck: Normal range of motion.  Cardiovascular: Normal rate, regular rhythm and normal heart sounds.   No murmur heard. Respiratory: Effort normal and breath sounds normal.  GI: Soft. Bowel sounds are normal.  Genitourinary:  Not pertinent to current symptomatology therefore not examined.  Musculoskeletal:  Examination of her left knee reveals previous well healed   incisions.  1+ effusion.  Diffuse pain.  Range of motion -5 to 125 degrees.  Knee is stable with normal patella tracking.  Examination of the right knee reveals full range of motion pain, swelling, weakness or instability.  Vascular exam: Pulses are 2+ and symmetric.    Neurological: She is alert and oriented to person, place, and time.  Skin: Skin is warm  and dry.  Psychiatric: She has a normal mood and affect. Her behavior is normal.    Vital signs in last 24 hours: Last recorded: 05/21 1500   BP: 104/64 Pulse: 67  Temp: 98.3 F (36.8 C)    Height: 5' 8" (1.727 m) SpO2: 97  Weight: 74.844 kg (165 lb)     Labs:   Estimated body mass index is 25.09 kg/(m^2) as calculated from the following:   Height as of this encounter: 5' 8" (1.727 m).   Weight as of this encounter: 74.844 kg (165 lb).   Imaging Review Plain radiographs demonstrate severe degenerative joint disease of the left knee(s). The overall alignment ismild valgus. The bone quality appears to be good for age and reported activity level.  Assessment/Plan:  End stage arthritis, left knee   The patient history, physical examination, clinical judgment of the provider and imaging studies are consistent with end stage degenerative joint disease of the left knee(s) and total knee arthroplasty is deemed medically necessary. The treatment options including medical management, injection therapy arthroscopy and arthroplasty were discussed at length. The risks and benefits of total knee arthroplasty were presented and reviewed. The risks due to aseptic loosening, infection, stiffness, patella tracking problems, thromboembolic complications and other imponderables were discussed. The patient acknowledged the explanation, agreed to proceed with the plan and consent was signed. Patient is being admitted for inpatient treatment for surgery, pain control, PT, OT, prophylactic antibiotics, VTE prophylaxis, progressive ambulation and ADL's and discharge planning. The patient is planning to be discharged home with home health services  Kevron Patella A. Mahmoud Blazejewski, PA-C Physician Assistant [facility] [phone]  03/17/2013, 4:00 PM 

## 2013-03-29 NOTE — Evaluation (Signed)
Physical Therapy Evaluation Patient Details Name: Rebecca Morrow MRN: [id number] DOB: [birth year] Today's Date: 03/29/2013 Time: 1540-1606 PT Time Calculation (min): 26 min  PT Assessment / Plan / Recommendation Clinical Impression  Patient is a 42 y/o female admitted s/p left TKA with decreased independence with mobility due to pain, decreased AROM and strength left LE and will benefit from skilled PT in the acute setting to allow return home with intermittent assist and HHPT.    PT Assessment  Patient needs continued PT services    Follow Up Recommendations  Home health PT;Supervision - Intermittent       Barriers to Discharge None      Equipment Recommendations  None recommended by PT       Frequency 7X/week    Precautions / Restrictions Precautions Precautions: Knee Required Braces or Orthoses: Knee Immobilizer - Left Knee Immobilizer - Left: Discontinue once straight leg raise with < 10 degree lag Restrictions Weight Bearing Restrictions: Yes (LEFT LEG) LLE Weight Bearing: Weight bearing as tolerated   Pertinent Vitals/Pain 5/10 left knee      Mobility  Bed Mobility Bed Mobility: Supine to Sit;Sitting - Scoot to Edge of Bed Supine to Sit: 4: Min assist;HOB elevated Sitting - Scoot to [place] of Bed: 4: Min assist Transfers Transfers: Sit to Stand;Stand to Sit Sit to Stand: 4: Min guard;From bed Stand to Sit: 4: Min guard;To chair/3-in-1;With armrests Details for Transfer Assistance: cues for technique and hand placement Ambulation/Gait Ambulation/Gait Assistance: 4: Min guard Ambulation Distance (Feet): 3 Feet Assistive device: Rolling walker Ambulation/Gait Assistance Details: pivotal steps to chair; cues for sequence Gait Pattern: Step-to pattern    Exercises Total Joint Exercises Ankle Circles/Pumps: AROM;Both;5 reps;Supine Quad Sets: AROM;Left;5 reps;Supine Short Arc Quad: AROM;Left;5 reps;Supine Heel Slides: AAROM;Left;5 reps;Supine   PT Diagnosis:  Difficulty walking;Acute pain  PT Problem List: Decreased strength;Decreased balance;Pain;Decreased mobility;Decreased knowledge of use of DME;Decreased knowledge of precautions PT Treatment Interventions: DME instruction;Gait training;Stair training;Functional mobility training;Therapeutic exercise;Patient/family education   PT Goals Acute Rehab PT Goals PT Goal Formulation: With patient Time For Goal Achievement: 04/02/13 Potential to Achieve Goals: Good Pt will go Supine/Side to Sit: with modified independence PT Goal: Supine/Side to Sit - Progress: Goal set today Pt will go Sit to Supine/Side: with modified independence PT Goal: Sit to Supine/Side - Progress: Goal set today Pt will go Sit to Stand: with modified independence PT Goal: Sit to Stand - Progress: Goal set today Pt will Ambulate: 51 - 150 feet;with rolling walker;with modified independence PT Goal: Ambulate - Progress: Goal set today Pt will Go Up / Down Stairs: 6-9 stairs;with rail(s);with crutches;with supervision PT Goal: Up/Down Stairs - Progress: Goal set today Pt will Perform Home Exercise Program: with supervision, verbal cues required/provided PT Goal: Perform Home Exercise Program - Progress: Goal set today Additional Goals Additional Goal #1: Patient to negotiate 2 steps with rolling walker with min assist for home entry PT Goal: Additional Goal #1 - Progress: Goal set today  Visit Information  Last PT Received On: 03/29/13 Assistance Needed: +1    Subjective Data  Subjective: It feels good to get up Patient Stated Goal: To go home   Prior Functioning  Home Living Lives With: Significant other Available Help at Discharge: Friend(s) Type of Home: House Home Layout: Multi-level Alternate Level Stairs-Number of Steps: 7 Alternate Level Stairs-Rails: Right Bathroom Shower/Tub: Health visitor: Standard Home Adaptive Equipment: Bedside commode/3-in-1;Walker - rolling;Other  (comment) Additional Comments: CPM delivered prior to admission Prior Function Level  of Independence: Independent Able to Take Stairs?: Yes Driving: Yes Vocation: Full time employment Comments: travelling job; 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 Careers adviser: No difficulties Dominant Hand: Right    Cognition  Cognition Arousal/Alertness: Awake/alert Behavior During Therapy: WFL for tasks assessed/performed Overall Cognitive Status: Within Functional Limits for tasks assessed    Extremity/Trunk Assessment Right Lower Extremity Assessment RLE ROM/Strength/Tone: [facility] for tasks assessed Left Lower Extremity Assessment LLE ROM/Strength/Tone: Deficits LLE ROM/Strength/Tone Deficits: knee flexion AAROM approx 45 degrees with ACE wrap intact; knee extension 3-/5; ankle AROM WFL LLE Sensation: WFL - Light Touch   Balance Balance Balance Assessed: Yes Static Standing Balance Static Standing - Balance Support: Bilateral upper extremity supported Static Standing - Level of Assistance: 4: Min assist  End of Session PT - End of Session Equipment Utilized During Treatment: Gait belt Activity Tolerance: Patient tolerated treatment well Patient left: in chair;with call bell/phone within reach;with family/visitor present CPM Left Knee CPM Left Knee: Off Left Knee Flexion (Degrees): 60 Left Knee Extension (Degrees): 0  GP     Ula Couvillon,CYNDI 03/29/2013, 4:32 PM [place], [place] [phone] 03/29/2013

## 2013-03-29 NOTE — Anesthesia Postprocedure Evaluation (Signed)
Anesthesia Post Note  Patient: Rebecca Morrow  Procedure(s) Performed: Procedure(s) (LRB): TOTAL KNEE ARTHROPLASTY (Left)  Anesthesia type: general  Patient location: PACU  Post pain: Pain level controlled  Post assessment: Patient's Cardiovascular Status Stable  Last Vitals:  Filed Vitals:   03/29/13 1230  BP: 94/61  Pulse: 73  Temp:   Resp: 16    Post vital signs: Reviewed and stable  Level of consciousness: sedated  Complications: No apparent anesthesia complications

## 2013-03-29 NOTE — Anesthesia Preprocedure Evaluation (Addendum)
Anesthesia Evaluation  Patient identified by MRN, date of birth, ID band Patient awake    Reviewed: Allergy & Precautions, H&P , NPO status , Patient's Chart, lab work & pertinent test results  History of Anesthesia Complications Negative for: history of anesthetic complications  Airway Mallampati: II      Dental  (+) Teeth Intact   Pulmonary neg pulmonary ROS,          Cardiovascular negative cardio ROS      Neuro/Psych  Neuromuscular disease negative psych ROS   GI/Hepatic negative GI ROS, Neg liver ROS,   Endo/Other  negative endocrine ROS  Renal/GU negative Renal ROS  negative genitourinary   Musculoskeletal negative musculoskeletal ROS (+)   Abdominal   Peds negative pediatric ROS (+)  Hematology negative hematology ROS (+)   Anesthesia Other Findings   Reproductive/Obstetrics negative OB ROS                          Anesthesia Physical Anesthesia Plan  ASA: I  Anesthesia Plan: General ETT and Regional   Post-op Pain Management:    Induction:   Airway Management Planned:   Additional Equipment:   Intra-op Plan:   Post-operative Plan:   Informed Consent: I have reviewed the patients History and Physical, chart, labs and discussed the procedure including the risks, benefits and alternatives for the proposed anesthesia with the patient or authorized representative who has indicated his/her understanding and acceptance.   Dental Advisory Given  Plan Discussed with: Anesthesiologist and CRNA  Anesthesia Plan Comments:         Anesthesia Quick Evaluation

## 2013-03-29 NOTE — Transfer of Care (Signed)
Immediate Anesthesia Transfer of Care Note  Patient: Rebecca Morrow  Procedure(s) Performed: Procedure(s): TOTAL KNEE ARTHROPLASTY (Left)  Patient Location: PACU  Anesthesia Type:General and Regional  Level of Consciousness: awake, alert , oriented and sedated  Airway & Oxygen Therapy: Patient Spontanous Breathing and Patient connected to nasal cannula oxygen  Post-op Assessment: Report given to PACU RN, Post -op Vital signs reviewed and stable and Patient moving all extremities  Post vital signs: Reviewed and stable  Complications: No apparent anesthesia complications

## 2013-03-29 NOTE — Care Management Note (Signed)
  Page 1 of 1   03/29/2013     3:42:23 PM   CARE MANAGEMENT NOTE 03/29/2013  Patient:  ELEONOR, OCON   Account Number:  [id number]  Date Initiated:  03/29/2013  Documentation initiated by:  Ronny Flurry  Subjective/Objective Assessment:     Action/Plan:   Anticipated DC Date:     Anticipated DC Plan:           Choice offered to / List presented to:             Status of service:   Medicare Important Message given?   (If response is "NO", the following Medicare IM given date fields will be blank) Date Medicare IM given:   Date Additional Medicare IM given:    Discharge Disposition:    Per UR Regulation:    If discussed at Long Length of Stay Meetings, dates discussed:    Comments:  03-29-13 Consult for home health needs . Patient states she is not doing home health , she [facility] on Wednesday and Friday . She has CPM , walker and BSC at home already .  Ronny Flurry RN BSN [phone]

## 2013-03-29 NOTE — Preoperative (Signed)
Beta Blockers   Reason not to administer Beta Blockers:Not Applicable 

## 2013-03-29 NOTE — Progress Notes (Signed)
Orthopedic Tech Progress Note Patient Details:  Rebecca Morrow 11-Oct-1971 [id number] Applied CPM to LLE.  Left Footsie Roll with pt.'s nurse. CPM Left Knee CPM Left Knee: On Left Knee Flexion (Degrees): 60 Left Knee Extension (Degrees): 0   [name] 03/29/2013, 12:20 PM

## 2013-03-29 NOTE — Progress Notes (Signed)
ARRIVED TO [id number] FROM PACU, A/OX4, ORIENTED TO ROOM AND SURROUNDINGS, FRIEND AT BEDSIDE

## 2013-03-29 NOTE — Anesthesia Procedure Notes (Signed)
Anesthesia Regional Block:  Femoral nerve block  Pre-Anesthetic Checklist: ,, timeout performed, Correct Patient, Correct Site, Correct Laterality, Correct Procedure, Correct Position, site marked, Risks and benefits discussed,  Surgical consent,  Pre-op evaluation,  At surgeon's request and post-op pain management  Laterality: Left  Prep: chloraprep       Needles:  Injection technique: Single-shot  Needle Type: Echogenic Stimulator Needle     Needle Length:cm 9 cm Needle Gauge: 21 G    Additional Needles:  Procedures: ultrasound guided (picture in chart) and nerve stimulator Femoral nerve block  Nerve Stimulator or Paresthesia:  Response: 0.4 mA,   Additional Responses:   Narrative:  Start time: 03/29/2013 7:25 AM End time: 03/29/2013 7:40 AM Injection made incrementally with aspirations every 5 mL.  Performed by: Personally  Anesthesiologist: Arta Bruce MD  Additional Notes: Monitors applied. Patient sedated. Sterile prep and drape,hand hygiene and sterile gloves were used. Relevant anatomy identified.Needle position confirmed.Local anesthetic injected incrementally after negative aspiration. Local anesthetic spread visualized around nerve(s). Vascular puncture avoided. No complications. Image printed for medical record.The patient tolerated the procedure well.       Femoral nerve block

## 2013-03-29 NOTE — Op Note (Signed)
MRN:     [id number] DOB/AGE:    January 18, 1971 / 42 y.o.       OPERATIVE REPORT    DATE OF PROCEDURE:  03/29/2013       PREOPERATIVE DIAGNOSIS:   DJD LEFT KNEE      Estimated body mass index is 24.80 kg/(m^2) as calculated from the following:   Height as of 03/25/13: 5\' 9"  (1.753 m).   Weight as of 08/27/12: 76.204 kg (168 lb).                                                        POSTOPERATIVE DIAGNOSIS:   DJD LEFT KNEE   RETAINED HARDWARE LEFT KNEE                                                                     PROCEDURE:  Procedure(s): TOTAL KNEE ARTHROPLASTY Using Depuy Sigma RP implants #4Narrow Femur, #4Tibia, 10mm sigma RP bearing, 35 Patella Removal of retained hardware right knee     SURGEON: Phyllistine Domingos A    ASSISTANT:  Kirstin Shepperson PA-C   (Present and scrubbed throughout the case, critical for assistance with exposure, retraction, instrumentation, and closure.)         ANESTHESIA: GET with Femoral Nerve Block  DRAINS: foley, 2 medium hemovac in knee   TOURNIQUET TIME:   COMPLICATIONS:  None     SPECIMENS: None   INDICATIONS FOR PROCEDURE: The patient has  DJD LEFT KNEE, varus deformities, XR shows bone on bone arthritis. Patient has failed all conservative measures including anti-inflammatory medicines, narcotics, attempts at  exercise and weight loss, cortisone injections and viscosupplementation.  Risks and benefits of surgery have been discussed, questions answered.   DESCRIPTION OF PROCEDURE: The patient identified by armband, received  right femoral nerve block and IV antibiotics, in the holding area at [facility]. Patient taken to the operating room, appropriate anesthetic  monitors were attached General endotracheal anesthesia induced with  the patient in supine position, Foley catheter was inserted. Tourniquet  applied high to the operative thigh. Lateral post and foot positioner  applied to the table, the lower extremity was then prepped  and draped  in usual sterile fashion from the ankle to the tourniquet. Time-out procedure was performed. The limb was wrapped with an Esmarch bandage and the tourniquet inflated to 365 mmHg. We began the operation by making the anterior midline incision starting at handbreadth above the patella going over the patella 1 cm medial to and  4 cm distal to the tibial tubercle. Small bleeders in the skin and the  subcutaneous tissue identified and cauterized. Transverse retinaculum was incised and reflected medially and a medial parapatellar arthrotomy was accomplished. the patella was everted and theprepatellar fat pad resected. The superficial medial collateral  ligament was then elevated from anterior to posterior along the proximal  flare of the tibia and anterior half of the menisci resected. The knee was hyperflexed exposing bone on bone arthritis. Peripheral and notch osteophytes as well as the cruciate ligaments were then resected. We continued to  work our  way around posteriorly along the proximal tibia, and externally  rotated the tibia subluxing it out from underneath the femur. At this point the retained metal tibial ACL screw was removed with a screwdriver.  A McHale  retractor was placed through the notch and a lateral Hohmann retractor  placed, and we then drilled through the proximal tibia in line with the  axis of the tibia followed by an intramedullary guide rod and 2-degree  posterior slope cutting guide. The tibial cutting guide was pinned into place  allowing resection of 6 mm of bone medially and about 3 mm of bone  laterally because of her varus deformity. Satisfied with the tibial resection, we then  entered the distal femur 2 mm anterior to the PCL origin with the  intramedullary guide rod and applied the distal femoral cutting guide  set at 11mm, with 5 degrees of valgus. This was pinned along the  epicondylar axis. At this point, the distal femoral cut was accomplished without  difficulty. We then sized for a #4Narrow femoral component and pinned the guide in 3 degrees of external rotation.The chamfer cutting guide was pinned into place. The anterior, posterior, and chamfer cuts were accomplished without difficulty followed by  the Sigma RP box cutting guide and the box cut. We also removed posterior osteophytes from the posterior femoral condyles. At this  time, the knee was brought into full extension. We checked our  extension and flexion gaps and found them symmetric at 10mm.  The patella thickness measured at 24 mm. We set the cutting guide at 15 and removed the posterior 9.5-10 mm  of the patella sized for 35 button and drilled the lollipop. The knee  was then once again hyperflexed exposing the proximal tibia. We sized for a #4 tibial base plate, applied the smokestack and the conical reamer followed by the the Delta fin keel punch. We then hammered into place the Sigma RP trial femoral component, inserted a 10-mm trial bearing, trial patellar button, and took the knee through range of motion from 0-130 degrees. No thumb pressure was required for patellar  The femoral ACL metal screw was not evident on exposure of the femur and during the femoral cuts, so it was left undisturbed. tracking. At this point, all trial components were removed, a double batch of DePuy HV cement with was mixed and applied to all bony metallic mating surfaces except for the posterior condyles of the femur itself. In order, we  hammered into place the tibial tray and removed excess cement, the femoral component and removed excess cement, a 10-mm Sigma RP bearing  was inserted, and the knee brought to full extension with compression.  The patellar button was clamped into place, and excess cement  removed. While the cement cured the wound was irrigated out with normal saline solution pulse lavage, and medium Hemovac drains were placed.. Ligament stability and patellar tracking were checked and found  to be excellent. The tourniquet was then released and hemostasis was obtained with cautery. The parapatellar arthrotomy was closed with  #1 ethibond suture. The subcutaneous tissue with 0 and 2-0 undyed  Vicryl suture, and 4-0 Monocryl.. A dressing of Xeroform,  4 x 4, dressing sponges, Webril, and Ace wrap applied. Needle and sponge count were correct times 2.The patient awakened, extubated, and taken to recovery room without difficulty. Vascular status was normal, pulses 2+ and symmetric.   Kadrian Partch A 03/29/2013, 10:46 AM

## 2013-03-29 NOTE — Interval H&P Note (Signed)
History and Physical Interval Note:  03/29/2013 8:53 AM  Rebecca Morrow  has presented today for surgery, with the diagnosis of DJD LEFT KNEE  The various methods of treatment have been discussed with the patient and family. After consideration of risks, benefits and other options for treatment, the patient has consented to  Procedure(s): TOTAL KNEE ARTHROPLASTY (Left) as a surgical intervention .  The patient's history has been reviewed, patient examined, no change in status, stable for surgery.  I have reviewed the patient's chart and labs.  Questions were answered to the patient's satisfaction.     Salvatore Marvel A

## 2013-03-30 ENCOUNTER — Encounter (HOSPITAL_COMMUNITY): Payer: Self-pay | Admitting: General Practice

## 2013-03-30 LAB — BASIC METABOLIC PANEL
CO2: 22 mEq/L (ref 19–32)
Chloride: 102 mEq/L (ref 96–112)
Creatinine, Ser: 0.61 mg/dL (ref 0.50–1.10)
Glucose, Bld: 178 mg/dL — ABNORMAL HIGH (ref 70–99)

## 2013-03-30 LAB — CBC
MCH: 30.8 pg (ref 26.0–34.0)
Platelets: 209 10*3/uL (ref 150–400)

## 2013-03-30 MED ORDER — ASPIRIN 325 MG PO TBEC: 325.0000 mg | DELAYED_RELEASE_TABLET | Freq: Every day | ORAL | Status: DC

## 2013-03-30 MED ORDER — BISACODYL 5 MG PO TBEC: DELAYED_RELEASE_TABLET | ORAL | Status: DC

## 2013-03-30 MED ORDER — DSS 100 MG PO CAPS: ORAL_CAPSULE | ORAL | Status: DC

## 2013-03-30 MED ORDER — ACETAMINOPHEN 325 MG PO TABS: 650.0000 mg | ORAL_TABLET | Freq: Four times a day (QID) | ORAL | Status: DC | PRN

## 2013-03-30 MED ORDER — METHOCARBAMOL 500 MG PO TABS: ORAL_TABLET | ORAL | Status: DC

## 2013-03-30 MED ORDER — ONDANSETRON HCL 4 MG PO TABS: 4.0000 mg | ORAL_TABLET | Freq: Three times a day (TID) | ORAL | Status: DC | PRN

## 2013-03-30 MED ORDER — OXYCODONE HCL 5 MG PO TABS: ORAL_TABLET | ORAL | Status: DC

## 2013-03-30 NOTE — Discharge Summary (Signed)
Patient ID: Rebecca Morrow MRN: [id number] DOB/AGE: [birth year] 42 y.o.  Admit date: 03/29/2013 Discharge date: 03/30/2013  Admission Diagnoses:  Principal Problem:   Left knee DJD Active Problems:   S/P ACL reconstruction   Retained orthopedic hardware   Discharge Diagnoses:  Same  Past Medical History  Diagnosis Date  . Arthritis     lt knee  . Ganglion cyst of wrist     LT  . Left knee DJD     Surgeries: Procedure(s): TOTAL KNEE ARTHROPLASTY on 03/29/2013   Consultants:    Discharged Condition: Improved  Hospital Course: Rebecca Morrow is an 42 y.o. female who was admitted 03/29/2013 for operative treatment ofLeft knee DJD. Patient has severe unremitting pain that affects sleep, daily activities, and work/hobbies. After pre-op clearance the patient was taken to the operating room on 03/29/2013 and underwent  Procedure(s): TOTAL KNEE ARTHROPLASTY.    Patient was given perioperative antibiotics: Anti-infectives   Start     Dose/Rate Route Frequency Ordered Stop   03/29/13 1600  ceFAZolin (ANCEF) IVPB 2 g/50 mL premix     2 g 100 mL/hr over 30 Minutes Intravenous Every 6 hours 03/29/13 1518 03/29/13 2151   03/29/13 0600  ceFAZolin (ANCEF) IVPB 2 g/50 mL premix     2 g 100 mL/hr over 30 Minutes Intravenous On call to O.R. 03/28/13 1346 03/29/13 0903       Patient was given sequential compression devices, early ambulation, and chemoprophylaxis to prevent DVT.  Patient benefited maximally from hospital stay and there were no complications.    Recent vital signs: Patient Vitals for the past 24 hrs:  BP Temp Temp src Pulse Resp SpO2 Height Weight  03/30/13 0504 111/52 mmHg 98.2 F (36.8 C) Oral 68 16 100 % - -  03/30/13 0400 - - - - 16 98 % - -  03/30/13 0223 107/60 mmHg 98.1 F (36.7 C) Oral 80 15 100 % - -  03/30/13 0000 - - - - 14 100 % - -  03/29/13 2206 108/70 mmHg 97.9 F (36.6 C) Oral 83 16 100 % - -  03/29/13 2000 - - - - 16 99 % - -  03/29/13 1741 125/68 mmHg 98.1  F (36.7 C) Oral 73 16 99 % - -  03/29/13 1515 130/72 mmHg 98.6 F (37 C) Oral 82 16 98 % 5\' 9"  (1.753 m) 76.2 kg (167 lb 15.9 oz)  03/29/13 1430 108/51 mmHg - - - - - - -  03/29/13 1400 92/51 mmHg - - 75 12 100 % - -  03/29/13 1345 - 98.2 F (36.8 C) - - - - - -  03/29/13 1330 102/52 mmHg - - 77 20 98 % - -  03/29/13 1300 115/59 mmHg 98 F (36.7 C) - - - - - -  03/29/13 1230 94/61 mmHg - - 73 16 100 % - -  03/29/13 1215 101/62 mmHg - - 74 14 99 % - -  03/29/13 1200 102/63 mmHg - - 90 17 95 % - -  03/29/13 1145 109/56 mmHg - - 81 17 97 % - -  03/29/13 1137 116/60 mmHg 97.9 F (36.6 C) - 77 15 97 % - -     Recent laboratory studies:  Recent Labs  03/30/13 0556  WBC 11.8*  HGB 11.9*  HCT 34.9*  PLT 209  NA 134*  K 4.0  CL 102  CO2 22  BUN 8  CREATININE 0.61  GLUCOSE 178*  CALCIUM 8.3*  Discharge Medications:     Medication List    TAKE these medications       acetaminophen 325 MG tablet  Commonly known as:  TYLENOL  Take 2 tablets (650 mg total) by mouth every 6 (six) hours as needed.     aspirin 325 MG EC tablet  Take 1 tablet (325 mg total) by mouth daily with breakfast.     bisacodyl 5 MG EC tablet  Commonly known as:  DULCOLAX  Take 2 tablets every night with dinner until bowel movement.  LAXITIVE.  Restart if two days since last bowel movement     celecoxib 200 MG capsule  Commonly known as:  CELEBREX  Take 200 mg by mouth 2 (two) times daily.     DSS 100 MG Caps  1 tab 2 times a day while on narcotics.  STOOL SOFTENER     methocarbamol 500 MG tablet  Commonly known as:  ROBAXIN  1 po q 8 hrs prn muscle spasm     ondansetron 4 MG tablet  Commonly known as:  ZOFRAN  Take 1-2 tablets (4-8 mg total) by mouth every 8 (eight) hours as needed for nausea.     oxyCODONE 5 MG immediate release tablet  Commonly known as:  Oxy IR/ROXICODONE  1-2 tablets every 4-6 hrs as needed for pain        Diagnostic Studies: Dg Chest 2 View  03/25/2013    *RADIOLOGY REPORT*  Clinical Data: Pre admission evaluation for left knee replacement. No current chest complaints  CHEST - 2 VIEW  Comparison: None.  Findings: Heart and mediastinal contours are within normal limits. The lung fields appear clear with no signs of focal infiltrate or congestive failure.  No pleural fluid or significant peribronchial cuffing is seen.  Bony structures appear intact.  IMPRESSION: No worrisome focal or acute cardiopulmonary abnormality identified.   Original Report Authenticated By: Rhodia Albright, M.D.    Disposition: 01-Home or Self Care      Discharge Orders   Future Orders Complete By Expires     CPM  As directed     Comments:      Continuous passive motion machine (CPM):      Use the CPM from 0 to 90 for 6 hours per day.       You may break it up into 2 or 3 sessions per day.      Use CPM for 2 weeks or until you are told to stop.    Call MD / Call 911  As directed     Comments:      If you experience chest pain or shortness of breath, CALL 911 and be transported to the hospital emergency room.  If you develope a fever above 101 F, pus (white drainage) or increased drainage or redness at the wound, or calf pain, call your surgeon's office.    Change dressing  As directed     Comments:      Change the dressing daily with sterile 4 x 4 inch gauze dressing and apply TED hose.  You may clean the incision with alcohol prior to redressing.    Constipation Prevention  As directed     Comments:      Drink plenty of fluids.  Prune juice may be helpful.  You may use a stool softener, such as Colace (over the counter) 100 mg twice a day.  Use MiraLax (over the counter) for constipation as needed.    Diet - low  sodium heart healthy  As directed     Discharge instructions  As directed     Comments:      Total Knee Replacement Care After Refer to this sheet in the next few weeks. These discharge instructions provide you with general information on caring for yourself  after you leave the hospital. Your caregiver may also give you specific instructions. Your treatment has been planned according to the most current medical practices available, but unavoidable complications sometimes occur. If you have any problems or questions after discharge, please call your caregiver. Regaining a near full range of motion of your knee within the first 3 to 6 weeks after surgery is critical. HOME CARE INSTRUCTIONS  You may resume a normal diet and activities as directed.  Perform exercises as directed.  Place gray foam block, curve side up under heel at all times except when in CPM or when walking.  DO NOT modify, tear, cut, or change in any way the gray foam block. You will receive physical therapy daily  Take showers instead of baths until informed otherwise.  You may shower on Sunday.  Please wash whole leg including wound with soap and water  Change bandages (dressings)daily It is OK to take over-the-counter tylenol in addition to the oxycodone for pain, discomfort, or fever. Oxycodone is VERY constipating.  Please take stool softener twice a day and laxatives daily until bowels are regular Eat a well-balanced diet.  Avoid lifting or driving until you are instructed otherwise.  Make an appointment to see your caregiver for stitches (suture) or staple removal as directed.  If you have been sent home with a continuous passive motion machine (CPM machine), 0-90 degrees 6 hrs a day   2 hrs a shift SEEK MEDICAL CARE IF: You have swelling of your calf or leg.  You develop shortness of breath or chest pain.  You have redness, swelling, or increasing pain in the wound.  There is pus or any unusual drainage coming from the surgical site.  You notice a bad smell coming from the surgical site or dressing.  The surgical site breaks open after sutures or staples have been removed.  There is persistent bleeding from the suture or staple line.  You are getting worse or are not  improving.  You have any other questions or concerns.  SEEK IMMEDIATE MEDICAL CARE IF:  You have a fever.  You develop a rash.  You have difficulty breathing.  You develop any reaction or side effects to medicines given.  Your knee motion is decreasing rather than improving.  MAKE SURE YOU:  Understand these instructions.  Will watch your condition.  Will get help right away if you are not doing well or get worse.    Do not put a pillow under the knee. Place it under the heel.  As directed     Comments:      Place gray foam block, curve side up under heel at all times except when in CPM or when walking.  DO NOT modify, tear, cut, or change in any way the gray foam block.    Increase activity slowly as tolerated  As directed     TED hose  As directed     Comments:      Use stockings (TED hose) for 2 weeks on both leg(s).  You may remove them at night for sleeping.       Follow-up Information   Follow up with Nilda Simmer, MD On 04/12/2013. (APPT  2 PM)    Contact information:   [address] [place] [address] [phone]       Follow up with Jeanene Erb, PT On 03/31/2013. (APPT TIME 3 PM)    Contact information:   [address] [place] [address] [phone]       Signed: Pascal Lux 03/30/2013, 9:01 AM

## 2013-03-30 NOTE — Progress Notes (Signed)
Pt discharged. Discharge instructions given and pt verbalized understanding.

## 2013-03-30 NOTE — Progress Notes (Signed)
Physical Therapy Treatment Patient Details Name: Rebecca Morrow MRN: [id number] DOB: October 24, 1971 Today's Date: 03/30/2013 Time: 4098-1191 PT Time Calculation (min): 24 min  PT Assessment / Plan / Recommendation Comments on Treatment Session  Moving extremely well at mod I level.  Knee flexion at 78* and pain controlled at 5/10.  Ready to D/C.  Will staqrt OPPT 5/4.    Follow Up Recommendations  Outpatient PT     Does the patient have the potential to tolerate intense rehabilitation     Barriers to Discharge        Equipment Recommendations  None recommended by PT    Recommendations for Other Services    Frequency     Plan Discharge plan remains appropriate    Precautions / Restrictions Precautions Precautions: Knee Restrictions Weight Bearing Restrictions: Yes LLE Weight Bearing: Weight bearing as tolerated   Pertinent Vitals/Pain     Mobility  Bed Mobility Bed Mobility: Supine to Sit;Sitting - Scoot to Edge of Bed Supine to Sit: 6: Modified independent (Device/Increase time) Sitting - Scoot to Edge of Bed: 6: Modified independent (Device/Increase time) Details for Bed Mobility Assistance: safe mobility Transfers Transfers: Sit to Stand;Stand to Sit Sit to Stand: 6: Modified independent (Device/Increase time) Stand to Sit: 6: Modified independent (Device/Increase time) Details for Transfer Assistance: safe mobility Ambulation/Gait Ambulation/Gait Assistance: 6: Modified independent (Device/Increase time) Ambulation Distance (Feet): 250 Feet Assistive device: Rolling walker Ambulation/Gait Assistance Details: safe use of the RW Gait Pattern: Step-through pattern Stairs: Yes Stairs Assistance: 6: Modified independent (Device/Increase time) Stair Management Technique: Step to pattern;Backwards;Forwards Number of Stairs: 7 Wheelchair Mobility Wheelchair Mobility: No    Exercises Other Exercises Other Exercises: knee flexion x 5 reps  AROM to 78*   PT Diagnosis:     PT Problem List:   PT Treatment Interventions:     PT Goals Acute Rehab PT Goals Potential to Achieve Goals: Good Pt will go Supine/Side to Sit: with modified independence PT Goal: Supine/Side to Sit - Progress: Met Pt will go Sit to Supine/Side: with modified independence PT Goal: Sit to Supine/Side - Progress: Met Pt will go Sit to Stand: with modified independence PT Goal: Sit to Stand - Progress: Met Pt will Ambulate: 51 - 150 feet;with rolling walker;with modified independence PT Goal: Ambulate - Progress: Met Pt will Go Up / Down Stairs: 6-9 stairs;with rail(s);with crutches;with supervision PT Goal: Up/Down Stairs - Progress: Met Pt will Perform Home Exercise Program: with supervision, verbal cues required/provided PT Goal: Perform Home Exercise Program - Progress: Not met Additional Goals Additional Goal #1: Patient to negotiate 2 steps with rolling walker with min assist for home entry PT Goal: Additional Goal #1 - Progress: Met  Visit Information  Last PT Received On: 03/30/13 Assistance Needed: +1    Subjective Data  Subjective: Oh yeh, the first day is always rough.   Cognition  Cognition Arousal/Alertness: Awake/alert Behavior During Therapy: WFL for tasks assessed/performed Overall Cognitive Status: Within Functional Limits for tasks assessed    Balance  Static Standing Balance Static Standing - Balance Support: During functional activity;Bilateral upper extremity supported Static Standing - Level of Assistance: 6: Modified independent (Device/Increase time)  End of Session PT - End of Session Activity Tolerance: Patient tolerated treatment well Patient left: in chair;with call bell/phone within reach;with family/visitor present Nurse Communication: Mobility status CPM Left Knee CPM Left Knee: On Left Knee Flexion (Degrees): 60   GP     Marajade Lei, Eliseo Gum 03/30/2013, 12:07 PM 03/30/2013  Dillon Bing,  PT [phone]  (pager)

## 2013-04-08 ENCOUNTER — Ambulatory Visit
Admission: RE | Admit: 2013-04-08 | Discharge: 2013-04-08 | Disposition: A | Discharge status: Home / Self Care | Payer: BC Managed Care – PPO | Source: Ambulatory Visit | Attending: Gastroenterology | Admitting: Gastroenterology

## 2013-04-08 ENCOUNTER — Other Ambulatory Visit: Payer: Self-pay | Admitting: Gastroenterology

## 2013-04-08 DIAGNOSIS — K5900 Constipation, unspecified: Secondary | ICD-10-CM

## 2013-09-13 ENCOUNTER — Other Ambulatory Visit (HOSPITAL_COMMUNITY)
Admission: RE | Admit: 2013-09-13 | Discharge: 2013-09-13 | Disposition: A | Discharge status: Home / Self Care | Payer: BC Managed Care – PPO | Source: Ambulatory Visit | Attending: Family Medicine | Admitting: Family Medicine

## 2013-09-13 ENCOUNTER — Other Ambulatory Visit: Payer: Self-pay | Admitting: Family Medicine

## 2013-09-13 DIAGNOSIS — Z01419 Encounter for gynecological examination (general) (routine) without abnormal findings: Secondary | ICD-10-CM | POA: Insufficient documentation

## 2014-08-22 ENCOUNTER — Ambulatory Visit (INDEPENDENT_AMBULATORY_CARE_PROVIDER_SITE_OTHER): Payer: BC Managed Care – PPO

## 2014-08-22 ENCOUNTER — Encounter: Payer: Self-pay | Admitting: Podiatry

## 2014-08-22 ENCOUNTER — Other Ambulatory Visit: Payer: Self-pay

## 2014-08-22 ENCOUNTER — Ambulatory Visit (INDEPENDENT_AMBULATORY_CARE_PROVIDER_SITE_OTHER): Payer: BC Managed Care – PPO | Admitting: Podiatry

## 2014-08-22 VITALS — BP 105/66 | HR 59 | Resp 16

## 2014-08-22 DIAGNOSIS — M2011 Hallux valgus (acquired), right foot: Secondary | ICD-10-CM

## 2014-08-22 DIAGNOSIS — Z1239 Encounter for other screening for malignant neoplasm of breast: Secondary | ICD-10-CM

## 2014-08-22 NOTE — Patient Instructions (Signed)

## 2014-08-22 NOTE — Progress Notes (Signed)
   Subjective:    Patient ID: Rebecca KetoPaula M Morrow, female    DOB: [birth year], 43 y.o.   MRN: [id number]  HPI Comments: "I have pain at this toe joint"  Patient c/o aching 1st MPJ right for several months. Has some sharp, shooting pains occasionally. Certain shoes and exercise aggravate. She takes Celebrex for other problems.  Foot Pain Associated symptoms include arthralgias.      Review of Systems  Musculoskeletal: Positive for arthralgias.  All other systems reviewed and are negative.      Objective:   Physical Exam        Assessment & Plan:

## 2014-08-22 NOTE — Progress Notes (Signed)
Subjective:     Patient ID: Rebecca Morrow, female   DOB: 29-Dec-1970, 43 y.o.   MRN: [id number]  Foot Pain   patient presents stating I have a painful bunion on my right foot that has gradually gotten worse over the last several years. I try to wear wider shoes and padding without relief Review of Systems  All other systems reviewed and are negative.      Objective:   Physical Exam  Nursing note and vitals reviewed. Constitutional: She is oriented to person, place, and time.  Cardiovascular: Intact distal pulses.   Musculoskeletal: Normal range of motion.  Neurological: She is oriented to person, place, and time.  Skin: Skin is warm.   neurovascular status intact with muscle strength adequate and range of motion of the subtalar and midtarsal joint within normal limits. Patient is noted to have hyperostosis medial aspect first metatarsal head right with redness and moderate diminishment of joint range of motion first MPJ. 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 I reviewed the condition with patient educating her on this and recommended structural bunion correction due to long-term nature of deformity and failure to respond to conservative care. Patient wants this done and will have done middle of December and we will reappoint for consult beginning of December. Encouraged to bring questions with her

## 2014-09-15 ENCOUNTER — Other Ambulatory Visit: Payer: Self-pay

## 2014-09-15 DIAGNOSIS — Z1231 Encounter for screening mammogram for malignant neoplasm of breast: Secondary | ICD-10-CM

## 2014-09-16 ENCOUNTER — Ambulatory Visit
Admission: RE | Admit: 2014-09-16 | Discharge: 2014-09-16 | Disposition: A | Discharge status: Home / Self Care | Payer: BC Managed Care – PPO | Source: Ambulatory Visit

## 2014-09-16 DIAGNOSIS — Z1231 Encounter for screening mammogram for malignant neoplasm of breast: Secondary | ICD-10-CM

## 2014-10-03 ENCOUNTER — Ambulatory Visit (INDEPENDENT_AMBULATORY_CARE_PROVIDER_SITE_OTHER): Payer: BC Managed Care – PPO | Admitting: Podiatry

## 2014-10-03 ENCOUNTER — Encounter: Payer: Self-pay | Admitting: Podiatry

## 2014-10-03 VITALS — BP 114/69 | HR 58 | Resp 16

## 2014-10-03 DIAGNOSIS — M2011 Hallux valgus (acquired), right foot: Secondary | ICD-10-CM

## 2014-10-03 NOTE — Progress Notes (Signed)
Subjective:     Patient ID: Rebecca Morrow, female   DOB: 04-30-1971, 43 y.o.   MRN: [id number]  HPI patient presents stating I'm ready to get the bunion fixed on my right foot. 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 Explained surgery to patient and allowed her to read the consent form going over with her all possible complications as outlined on the consent form and describing and discussing procedure area after significant review she signed consent form and was given all preoperative instructions [facility]. Patient also was dispensed air fracture walker with instructions and will have surgery done in the next several weeks and is encouraged to call with any questions prior to surgery

## 2014-10-03 NOTE — Patient Instructions (Signed)
Pre-Operative Instructions  Congratulations, you have decided to take an important step to improving your quality of life.  You can be assured that the doctors of [facility] will be with you every step of the way.  1. Plan to be at the surgery center/hospital at least 1 (one) hour prior to your scheduled time unless otherwise directed by the surgical center/hospital staff.  You must have a responsible adult accompany you, remain during the surgery and drive you home.  Make sure you have directions to the surgical center/hospital and know how to get there on time. 2. For hospital based surgery you will need to obtain a history and physical form from your family physician within 1 month prior to the date of surgery- we will give you a form for you primary physician.  3. We make every effort to accommodate the date you request for surgery.  There are however, times where surgery dates or times have to be moved.  We will contact you as soon as possible if a change in schedule is required.   4. No Aspirin/Ibuprofen for one week before surgery.  If you are on aspirin, any non-steroidal anti-inflammatory medications (Mobic, Aleve, Ibuprofen) you should stop taking it 7 days prior to your surgery.  You make take Tylenol  For pain prior to surgery.  5. Medications- If you are taking daily heart and blood pressure medications, seizure, reflux, allergy, asthma, anxiety, pain or diabetes medications, make sure the surgery center/hospital is aware before the day of surgery so they may notify you which medications to take or avoid the day of surgery. 6. No food or drink after midnight the night before surgery unless directed otherwise by surgical center/hospital staff. 7. No alcoholic beverages 24 hours prior to surgery.  No smoking 24 hours prior to or 24 hours after surgery. 8. Wear loose pants or shorts- loose enough to fit over bandages, boots, and casts. 9. No slip on shoes, sneakers are best. 10. Bring  your boot with you to the surgery center/hospital.  Also bring crutches or a walker if your physician has prescribed it for you.  If you do not have this equipment, it will be provided for you after surgery. 11. If you have not been contracted by the surgery center/hospital by the day before your surgery, call to confirm the date and time of your surgery. 12. Leave-time from work may vary depending on the type of surgery you have.  Appropriate arrangements should be made prior to surgery with your employer. 13. Prescriptions will be provided immediately following surgery by your doctor.  Have these filled as soon as possible after surgery and take the medication as directed. 14. Remove nail polish on the operative foot. 15. Wash the night before surgery.  The night before surgery wash the foot and leg well with the antibacterial soap provided and water paying special attention to beneath the toenails and in between the toes.  Rinse thoroughly with water and dry well with a towel.  Perform this wash unless told not to do so by your physician.  Enclosed: 1 Ice pack (please put in freezer the night before surgery)   1 Hibiclens skin cleaner   Pre-op Instructions  If you have any questions regarding the instructions, do not hesitate to call our office.  [place]: [address] , [place] [address] [phone]  [place]: [address] [place], [place] [address] [phone]  [place]: [address]  [place],  [address] [phone]  Dr. Richard   Tuchman DPM, Dr. Norman Regal DPM Dr. Richard Sikora DPM, Dr. M. Todd Hyatt DPM, Dr. Kathryn Egerton DPM 

## 2014-10-17 ENCOUNTER — Telehealth: Payer: Self-pay | Admitting: *Deleted

## 2014-10-17 NOTE — Telephone Encounter (Signed)
Pt states she has surgery 10/18/2014 and has not received a time.  I called pt, she states surgical center called and her time is 1115am to arrive 1015am.

## 2014-10-18 DIAGNOSIS — M2011 Hallux valgus (acquired), right foot: Secondary | ICD-10-CM

## 2014-10-24 ENCOUNTER — Ambulatory Visit (INDEPENDENT_AMBULATORY_CARE_PROVIDER_SITE_OTHER): Payer: BC Managed Care – PPO

## 2014-10-24 ENCOUNTER — Ambulatory Visit (INDEPENDENT_AMBULATORY_CARE_PROVIDER_SITE_OTHER): Payer: BC Managed Care – PPO | Admitting: Podiatry

## 2014-10-24 ENCOUNTER — Encounter: Payer: Self-pay | Admitting: Podiatry

## 2014-10-24 VITALS — BP 110/61 | HR 66 | Resp 16

## 2014-10-24 DIAGNOSIS — M2011 Hallux valgus (acquired), right foot: Secondary | ICD-10-CM

## 2014-10-26 NOTE — Progress Notes (Signed)
Subjective:     Patient ID: Rebecca Morrow, female   DOB: February 08, 1971, 43 y.o.   MRN: [id number]  HPI patient states she's doing real good with her right bunion correction is walking with minimal discomfort or swelling   Review of Systems     Objective:   Physical Exam Neurovascular status intact negative Homans sign noted with well-healing surgical site right first metatarsal with wound edges well coapted hallux in rectus position and good range of motion of the first MPJ with no restrictions    Assessment:     Doing well post [place] bunionectomy 6 days    Plan:     Reviewed x-rays and reviewed gradual surgical shoe gear usage and continued elevation compression and immobilization. Reappoint again in approximate 3 weeks earlier if any issues should occur

## 2014-11-07 ENCOUNTER — Other Ambulatory Visit: Payer: BC Managed Care – PPO | Admitting: *Deleted

## 2014-11-07 ENCOUNTER — Encounter: Payer: Self-pay | Admitting: Podiatry

## 2014-11-07 ENCOUNTER — Ambulatory Visit (INDEPENDENT_AMBULATORY_CARE_PROVIDER_SITE_OTHER): Payer: BLUE CROSS/BLUE SHIELD

## 2014-11-07 ENCOUNTER — Ambulatory Visit (INDEPENDENT_AMBULATORY_CARE_PROVIDER_SITE_OTHER): Payer: BLUE CROSS/BLUE SHIELD | Admitting: Podiatry

## 2014-11-07 ENCOUNTER — Ambulatory Visit: Payer: Self-pay

## 2014-11-07 VITALS — BP 138/74 | HR 66 | Resp 16

## 2014-11-07 DIAGNOSIS — Z9889 Other specified postprocedural states: Secondary | ICD-10-CM

## 2014-11-09 ENCOUNTER — Encounter: Payer: Self-pay | Admitting: Podiatry

## 2014-11-09 NOTE — Progress Notes (Unsigned)
DOS 10/18/2014 right [place] bunionectomy

## 2014-11-09 NOTE — Progress Notes (Signed)
Subjective:     Patient ID: Rebecca KetoPaula M Morrow, female   DOB: 14-Aug-1971, 44 y.o.   MRN: [id number]  HPI patient states I'm doing well with my foot with my pen intact and good alignment   Review of Systems     Objective:   Physical Exam Neurovascular status intact muscle strength adequate range of motion within normal limits with good position of the second toe with pin in place    Assessment:     Doing well post osteotomy digital fusion    Plan:     X-rays reviewed and sterile dressing reapplied and reappoint 2 weeks for pin removal or earlier if needed

## 2014-12-26 ENCOUNTER — Other Ambulatory Visit: Payer: Self-pay

## 2015-01-09 ENCOUNTER — Encounter: Payer: Self-pay | Admitting: Podiatry

## 2015-01-09 ENCOUNTER — Ambulatory Visit (INDEPENDENT_AMBULATORY_CARE_PROVIDER_SITE_OTHER): Payer: BLUE CROSS/BLUE SHIELD

## 2015-01-09 ENCOUNTER — Ambulatory Visit (INDEPENDENT_AMBULATORY_CARE_PROVIDER_SITE_OTHER): Payer: BLUE CROSS/BLUE SHIELD | Admitting: Podiatry

## 2015-01-09 VITALS — BP 112/68 | HR 63 | Resp 16

## 2015-01-09 DIAGNOSIS — Z9889 Other specified postprocedural states: Secondary | ICD-10-CM

## 2015-01-10 NOTE — Progress Notes (Signed)
Subjective:     Patient ID: Rebecca Morrow, female   DOB: 25-May-1971, 44 y.o.   MRN: [id number]  HPI patient presents stating I'm doing really well   Review of Systems     Objective:   Physical Exam Neurovascular status intact negative Homans sign noted with excellent healing incision site first metatarsal right foot with good alignment noted    Assessment:     Doing well from structural bunion correction right    Plan:     H&P and final x-ray reviewed today. At this time I'm allowing her to return to normal activity and she will be seen back on an as-needed basis

## 2015-07-28 NOTE — Progress Notes (Signed)
This encounter was created in error - please disregard.

## 2015-10-12 ENCOUNTER — Encounter: Payer: Self-pay | Admitting: Internal Medicine

## 2015-10-12 ENCOUNTER — Ambulatory Visit (INDEPENDENT_AMBULATORY_CARE_PROVIDER_SITE_OTHER): Payer: BLUE CROSS/BLUE SHIELD | Admitting: Internal Medicine

## 2015-10-12 ENCOUNTER — Other Ambulatory Visit (INDEPENDENT_AMBULATORY_CARE_PROVIDER_SITE_OTHER): Payer: BLUE CROSS/BLUE SHIELD

## 2015-10-12 VITALS — BP 118/70 | HR 63 | Temp 97.9°F | Resp 12 | Ht 69.0 in | Wt 171.1 lb

## 2015-10-12 DIAGNOSIS — Z0000 Encounter for general adult medical examination without abnormal findings: Secondary | ICD-10-CM | POA: Diagnosis not present

## 2015-10-12 DIAGNOSIS — Z23 Encounter for immunization: Secondary | ICD-10-CM

## 2015-10-12 LAB — COMPREHENSIVE METABOLIC PANEL
ALBUMIN: 4.2 g/dL (ref 3.5–5.2)
ALK PHOS: 34 U/L — AB (ref 39–117)
ALT: 12 U/L (ref 0–35)
AST: 17 U/L (ref 0–37)
BILIRUBIN TOTAL: 0.8 mg/dL (ref 0.2–1.2)
BUN: 14 mg/dL (ref 6–23)
CO2: 27 mEq/L (ref 19–32)
CREATININE: 0.84 mg/dL (ref 0.40–1.20)
Calcium: 9.1 mg/dL (ref 8.4–10.5)
Chloride: 105 mEq/L (ref 96–112)
GFR: 77.39 mL/min (ref >60.00)
Glucose, Bld: 93 mg/dL (ref 70–99)
Potassium: 4.6 mEq/L (ref 3.5–5.1)
SODIUM: 139 meq/L (ref 135–145)
TOTAL PROTEIN: 7.1 g/dL (ref 6.0–8.3)

## 2015-10-12 LAB — LIPID PANEL
CHOLESTEROL: 133 mg/dL (ref 0–200)
HDL: 52.8 mg/dL (ref >39.00)
LDL Cholesterol: 64 mg/dL (ref 0–99)
NONHDL: 80.51
Total CHOL/HDL Ratio: 3
Triglycerides: 85 mg/dL (ref 0.0–149.0)
VLDL: 17 mg/dL (ref 0.0–40.0)

## 2015-10-12 LAB — TSH: TSH: 1.17 u[IU]/mL (ref 0.35–4.50)

## 2015-10-12 NOTE — Patient Instructions (Signed)
We have given you the flu shot today and will check the labs.   You can come back next year or sooner if you have any new problems or questions.   I hope you have a good holidays!  Health Maintenance, Female Adopting a healthy lifestyle and getting preventive care can go a long way to promote health and wellness. Talk with your health care provider about what schedule of regular examinations is right for you. This is a good chance for you to check in with your provider about disease prevention and staying healthy. In between checkups, there are plenty of things you can do on your own. Experts have done a lot of research about which lifestyle changes and preventive measures are most likely to keep you healthy. Ask your health care provider for more information. WEIGHT AND DIET  Eat a healthy diet  Be sure to include plenty of vegetables, fruits, low-fat dairy products, and lean protein.  Do not eat a lot of foods high in solid fats, added sugars, or salt.  Get regular exercise. This is one of the most important things you can do for your health.  Most adults should exercise for at least 150 minutes each week. The exercise should increase your heart rate and make you sweat (moderate-intensity exercise).  Most adults should also do strengthening exercises at least twice a week. This is in addition to the moderate-intensity exercise.  Maintain a healthy weight  Body mass index (BMI) is a measurement that can be used to identify possible weight problems. It estimates body fat based on height and weight. Your health care provider can help determine your BMI and help you achieve or maintain a healthy weight.  For females 56 years of age and older:   A BMI below 18.5 is considered underweight.  A BMI of 18.5 to 24.9 is normal.  A BMI of 25 to 29.9 is considered overweight.  A BMI of 30 and above is considered obese.  Watch levels of cholesterol and blood lipids  You should start having  your blood tested for lipids and cholesterol at 46 years of age, then have this test every 5 years.  You may need to have your cholesterol levels checked more often if:  Your lipid or cholesterol levels are high.  You are older than 46 years of age.  You are at high risk for heart disease.  CANCER SCREENING   Lung Cancer  Lung cancer screening is recommended for adults 73-88 years old who are at high risk for lung cancer because of a history of smoking.  A yearly low-dose CT scan of the lungs is recommended for people who:  Currently smoke.  Have quit within the past 15 years.  Have at least a 30-pack-year history of smoking. A pack year is smoking an average of one pack of cigarettes a day for 1 year.  Yearly screening should continue until it has been 15 years since you quit.  Yearly screening should stop if you develop a health problem that would prevent you from having lung cancer treatment.  Breast Cancer  Practice breast self-awareness. This means understanding how your breasts normally appear and feel.  It also means doing regular breast self-exams. Let your health care provider know about any changes, no matter how small.  If you are in your 20s or 30s, you should have a clinical breast exam (CBE) by a health care provider every 1-3 years as part of a regular health exam.  If  you are 40 or older, have a CBE every year. Also consider having a breast X-ray (mammogram) every year.  If you have a family history of breast cancer, talk to your health care provider about genetic screening.  If you are at high risk for breast cancer, talk to your health care provider about having an MRI and a mammogram every year.  Breast cancer gene (BRCA) assessment is recommended for women who have family members with BRCA-related cancers. BRCA-related cancers include:  Breast.  Ovarian.  Tubal.  Peritoneal cancers.  Results of the assessment will determine the need for genetic  counseling and BRCA1 and BRCA2 testing. Cervical Cancer Your health care provider may recommend that you be screened regularly for cancer of the pelvic organs (ovaries, uterus, and vagina). This screening involves a pelvic examination, including checking for microscopic changes to the surface of your cervix (Pap test). You may be encouraged to have this screening done every 3 years, beginning at age 13.  For women ages 32-65, health care providers may recommend pelvic exams and Pap testing every 3 years, or they may recommend the Pap and pelvic exam, combined with testing for human papilloma virus (HPV), every 5 years. Some types of HPV increase your risk of cervical cancer. Testing for HPV may also be done on women of any age with unclear Pap test results.  Other health care providers may not recommend any screening for nonpregnant women who are considered low risk for pelvic cancer and who do not have symptoms. Ask your health care provider if a screening pelvic exam is right for you.  If you have had past treatment for cervical cancer or a condition that could lead to cancer, you need Pap tests and screening for cancer for at least 20 years after your treatment. If Pap tests have been discontinued, your risk factors (such as having a new sexual partner) need to be reassessed to determine if screening should resume. Some women have medical problems that increase the chance of getting cervical cancer. In these cases, your health care provider may recommend more frequent screening and Pap tests. Colorectal Cancer  This type of cancer can be detected and often prevented.  Routine colorectal cancer screening usually begins at 46 years of age and continues through 46 years of age.  Your health care provider may recommend screening at an earlier age if you have risk factors for colon cancer.  Your health care provider may also recommend using home test kits to check for hidden blood in the stool.  A  small camera at the end of a tube can be used to examine your colon directly (sigmoidoscopy or colonoscopy). This is done to check for the earliest forms of colorectal cancer.  Routine screening usually begins at age 75.  Direct examination of the colon should be repeated every 5-10 years through 46 years of age. However, you may need to be screened more often if early forms of precancerous polyps or small growths are found. Skin Cancer  Check your skin from head to toe regularly.  Tell your health care provider about any new moles or changes in moles, especially if there is a change in a mole's shape or color.  Also tell your health care provider if you have a mole that is larger than the size of a pencil eraser.  Always use sunscreen. Apply sunscreen liberally and repeatedly throughout the day.  Protect yourself by wearing long sleeves, pants, a wide-brimmed hat, and sunglasses whenever you are  outside. HEART DISEASE, DIABETES, AND HIGH BLOOD PRESSURE   High blood pressure causes heart disease and increases the risk of stroke. High blood pressure is more likely to develop in:  People who have blood pressure in the high end of the normal range (130-139/85-89 mm Hg).  People who are overweight or obese.  People who are African American.  If you are 15-59 years of age, have your blood pressure checked every 3-5 years. If you are 11 years of age or older, have your blood pressure checked every year. You should have your blood pressure measured twice--once when you are at a hospital or clinic, and once when you are not at a hospital or clinic. Record the average of the two measurements. To check your blood pressure when you are not at a hospital or clinic, you can use:  An automated blood pressure machine at a pharmacy.  A home blood pressure monitor.  If you are between 57 years and 75 years old, ask your health care provider if you should take aspirin to prevent strokes.  Have  regular diabetes screenings. This involves taking a blood sample to check your fasting blood sugar level.  If you are at a normal weight and have a low risk for diabetes, have this test once every three years after 46 years of age.  If you are overweight and have a high risk for diabetes, consider being tested at a younger age or more often. PREVENTING INFECTION  Hepatitis B  If you have a higher risk for hepatitis B, you should be screened for this virus. You are considered at high risk for hepatitis B if:  You were born in a country where hepatitis B is common. Ask your health care provider which countries are considered high risk.  Your parents were born in a high-risk country, and you have not been immunized against hepatitis B (hepatitis B vaccine).  You have HIV or AIDS.  You use needles to inject street drugs.  You live with someone who has hepatitis B.  You have had sex with someone who has hepatitis B.  You get hemodialysis treatment.  You take certain medicines for conditions, including cancer, organ transplantation, and autoimmune conditions. Hepatitis C  Blood testing is recommended for:  Everyone born from [birth year] through 1967.  Anyone with known risk factors for hepatitis C. Sexually transmitted infections (STIs)  You should be screened for sexually transmitted infections (STIs) including gonorrhea and chlamydia if:  You are sexually active and are younger than 46 years of age.  You are older than 46 years of age and your health care provider tells you that you are at risk for this type of infection.  Your sexual activity has changed since you were last screened and you are at an increased risk for chlamydia or gonorrhea. Ask your health care provider if you are at risk.  If you do not have HIV, but are at risk, it may be recommended that you take a prescription medicine daily to prevent HIV infection. This is called pre-exposure prophylaxis (PrEP). You are  considered at risk if:  You are sexually active and do not regularly use condoms or know the HIV status of your partner(s).  You take drugs by injection.  You are sexually active with a partner who has HIV. Talk with your health care provider about whether you are at high risk of being infected with HIV. If you choose to begin PrEP, you should first be tested for HIV.  You should then be tested every 3 months for as long as you are taking PrEP.  PREGNANCY   If you are premenopausal and you may become pregnant, ask your health care provider about preconception counseling.  If you may become pregnant, take 400 to 800 micrograms (mcg) of folic acid every day.  If you want to prevent pregnancy, talk to your health care provider about birth control (contraception). OSTEOPOROSIS AND MENOPAUSE   Osteoporosis is a disease in which the bones lose minerals and strength with aging. This can result in serious bone fractures. Your risk for osteoporosis can be identified using a bone density scan.  If you are 20 years of age or older, or if you are at risk for osteoporosis and fractures, ask your health care provider if you should be screened.  Ask your health care provider whether you should take a calcium or vitamin D supplement to lower your risk for osteoporosis.  Menopause may have certain physical symptoms and risks.  Hormone replacement therapy may reduce some of these symptoms and risks. Talk to your health care provider about whether hormone replacement therapy is right for you.  HOME CARE INSTRUCTIONS   Schedule regular health, dental, and eye exams.  Stay current with your immunizations.   Do not use any tobacco products including cigarettes, chewing tobacco, or electronic cigarettes.  If you are pregnant, do not drink alcohol.  If you are breastfeeding, limit how much and how often you drink alcohol.  Limit alcohol intake to no more than 1 drink per day for nonpregnant women. One  drink equals 12 ounces of beer, 5 ounces of wine, or 1 ounces of hard liquor.  Do not use street drugs.  Do not share needles.  Ask your health care provider for help if you need support or information about quitting drugs.  Tell your health care provider if you often feel depressed.  Tell your health care provider if you have ever been abused or do not feel safe at home.   This information is not intended to replace advice given to you by your health care provider. Make sure you discuss any questions you have with your health care provider.   Document Released: 04/29/2011 Document Revised: 11/04/2014 Document Reviewed: 09/15/2013 Elsevier Interactive Patient Education [employer].

## 2015-10-12 NOTE — Progress Notes (Signed)
   Subjective:    Patient ID: Rebecca Morrow, female    DOB: [birth year], 44 y.o.   MRN: [id number]  HPI The patient is a new 44 YO female coming in for wellness. No new concerns. Some old orthopedic concerns which are doing well right now. Non-smoker and exercises regularly.   [facility], social history reviewed and updated.   Review of Systems  Constitutional: Negative for fever, activity change, appetite change, fatigue and unexpected weight change.  HENT: Negative.   Eyes: Negative.   Respiratory: Negative for cough, chest tightness, shortness of breath and wheezing.   Cardiovascular: Negative for chest pain, palpitations and leg swelling.  Gastrointestinal: Negative for nausea, abdominal pain, diarrhea, constipation and abdominal distention.  Musculoskeletal: Negative.   Skin: Negative.   Neurological: Negative.   Psychiatric/Behavioral: Negative.       Objective:   Physical Exam  Constitutional: She is oriented to person, place, and time. She appears well-developed and well-nourished.  HENT:  Head: Normocephalic and atraumatic.  Eyes: EOM are normal.  Neck: Normal range of motion.  Cardiovascular: Normal rate and regular rhythm.   Pulmonary/Chest: Effort normal and breath sounds normal. No respiratory distress. She has no wheezes.  Abdominal: Soft. Bowel sounds are normal. She exhibits no distension. There is no tenderness. There is no rebound.  Musculoskeletal: She exhibits no edema.  Neurological: She is alert and oriented to person, place, and time. Coordination normal.  Skin: Skin is warm and dry.  Psychiatric: She has a normal mood and affect.   Filed Vitals:   10/12/15 1035  BP: 118/70  Pulse: 63  Temp: 97.9 F (36.6 C)  TempSrc: Oral  Resp: 12  Height: 5\' 9"  (1.753 m)  Weight: 171 lb 1.9 oz (77.62 kg)  SpO2: 99%      Assessment & Plan:  Flu shot given at visit.

## 2015-10-12 NOTE — Assessment & Plan Note (Signed)
Non-smoker. Exercises regularly. Checking labs today. Flu shot given at visit to update her immunizations. Prior HIV screening negative. Talked to her about sunscreen for skin cancer prevention. Given screening recommendations. Gets yearly mammogram and gyn exam.

## 2015-10-12 NOTE — Progress Notes (Signed)
Pre visit review using our clinic review tool, if applicable. No additional management support is needed unless otherwise documented below in the visit note. 

## 2015-12-01 ENCOUNTER — Other Ambulatory Visit: Payer: Self-pay

## 2015-12-01 DIAGNOSIS — Z1231 Encounter for screening mammogram for malignant neoplasm of breast: Secondary | ICD-10-CM

## 2015-12-11 ENCOUNTER — Ambulatory Visit
Admission: RE | Admit: 2015-12-11 | Discharge: 2015-12-11 | Disposition: A | Discharge status: Home / Self Care | Payer: BLUE CROSS/BLUE SHIELD | Source: Ambulatory Visit

## 2015-12-11 DIAGNOSIS — Z1231 Encounter for screening mammogram for malignant neoplasm of breast: Secondary | ICD-10-CM

## 2016-02-15 DIAGNOSIS — M7712 Lateral epicondylitis, left elbow: Secondary | ICD-10-CM | POA: Diagnosis not present

## 2016-02-15 DIAGNOSIS — M25552 Pain in left hip: Secondary | ICD-10-CM | POA: Diagnosis not present

## 2016-06-11 DIAGNOSIS — M25552 Pain in left hip: Secondary | ICD-10-CM | POA: Diagnosis not present

## 2016-06-14 DIAGNOSIS — M25522 Pain in left elbow: Secondary | ICD-10-CM | POA: Diagnosis not present

## 2016-07-29 ENCOUNTER — Other Ambulatory Visit: Payer: Self-pay | Admitting: Gastroenterology

## 2016-07-29 DIAGNOSIS — K219 Gastro-esophageal reflux disease without esophagitis: Secondary | ICD-10-CM | POA: Diagnosis not present

## 2016-07-29 DIAGNOSIS — R1310 Dysphagia, unspecified: Secondary | ICD-10-CM | POA: Diagnosis not present

## 2016-07-29 DIAGNOSIS — R109 Unspecified abdominal pain: Secondary | ICD-10-CM | POA: Diagnosis not present

## 2016-07-29 DIAGNOSIS — R1013 Epigastric pain: Secondary | ICD-10-CM

## 2016-07-31 ENCOUNTER — Other Ambulatory Visit: Payer: BLUE CROSS/BLUE SHIELD

## 2016-08-08 ENCOUNTER — Ambulatory Visit
Admission: RE | Admit: 2016-08-08 | Discharge: 2016-08-08 | Disposition: A | Discharge status: Home / Self Care | Payer: BLUE CROSS/BLUE SHIELD | Source: Ambulatory Visit | Attending: Gastroenterology | Admitting: Gastroenterology

## 2016-08-08 DIAGNOSIS — R1013 Epigastric pain: Secondary | ICD-10-CM

## 2016-08-08 DIAGNOSIS — R1310 Dysphagia, unspecified: Secondary | ICD-10-CM

## 2016-08-08 DIAGNOSIS — K219 Gastro-esophageal reflux disease without esophagitis: Secondary | ICD-10-CM

## 2016-08-20 DIAGNOSIS — M7712 Lateral epicondylitis, left elbow: Secondary | ICD-10-CM | POA: Diagnosis not present

## 2016-09-16 DIAGNOSIS — M7712 Lateral epicondylitis, left elbow: Secondary | ICD-10-CM | POA: Diagnosis not present

## 2016-10-18 DIAGNOSIS — M7712 Lateral epicondylitis, left elbow: Secondary | ICD-10-CM | POA: Diagnosis not present

## 2016-10-18 DIAGNOSIS — M66832 Spontaneous rupture of other tendons, left forearm: Secondary | ICD-10-CM | POA: Diagnosis not present

## 2016-10-24 DIAGNOSIS — M7712 Lateral epicondylitis, left elbow: Secondary | ICD-10-CM | POA: Diagnosis not present

## 2016-10-31 DIAGNOSIS — M7712 Lateral epicondylitis, left elbow: Secondary | ICD-10-CM | POA: Diagnosis not present

## 2016-11-08 ENCOUNTER — Encounter: Payer: Self-pay | Admitting: Internal Medicine

## 2016-11-08 ENCOUNTER — Ambulatory Visit (INDEPENDENT_AMBULATORY_CARE_PROVIDER_SITE_OTHER): Payer: BLUE CROSS/BLUE SHIELD | Admitting: Internal Medicine

## 2016-11-08 VITALS — BP 130/70 | HR 102 | Temp 98.7°F | Resp 20 | Wt 174.0 lb

## 2016-11-08 DIAGNOSIS — J0100 Acute maxillary sinusitis, unspecified: Secondary | ICD-10-CM

## 2016-11-08 DIAGNOSIS — S0083XA Contusion of other part of head, initial encounter: Secondary | ICD-10-CM | POA: Diagnosis not present

## 2016-11-08 DIAGNOSIS — J019 Acute sinusitis, unspecified: Secondary | ICD-10-CM | POA: Insufficient documentation

## 2016-11-08 MED ORDER — HYDROCODONE-HOMATROPINE 5-1.5 MG/5ML PO SYRP: 5.0000 mL | ORAL_SOLUTION | Freq: Four times a day (QID) | ORAL | 0 refills | Status: AC | PRN

## 2016-11-08 MED ORDER — AZITHROMYCIN 250 MG PO TABS: ORAL_TABLET | ORAL | 1 refills | Status: DC

## 2016-11-08 NOTE — Progress Notes (Signed)
Pre visit review using our clinic review tool, if applicable. No additional management support is needed unless otherwise documented below in the visit note. 

## 2016-11-08 NOTE — Progress Notes (Signed)
   Subjective:    Patient ID: Rebecca Morrow, female    DOB: February 12, 1971, [age] y.o.   MRN: [id number]  HPI   Here with 2-3 days acute onset fever, facial pain, pressure, headache, general weakness and malaise, and greenish d/c, with mild ST and cough, but pt denies chest pain, wheezing, increased sob or doe, orthopnea, PND, increased LE swelling, palpitations, dizziness or syncope. Pt denies new neurological symptoms such as new headache, or facial or extremity weakness or numbness   Pt denies polydipsia, polyuria.  Did also unfortunately had a fall to right face 1 wk ago [place]  Still swelling but much improved right maxillary area and bruising now much improved Past Medical History:  Diagnosis Date  . Arthritis    lt knee  . Ganglion cyst of wrist    LT  . Kidney stones   . Left knee DJD    Past Surgical History:  Procedure Laterality Date  . GANGLION CYST EXCISION  08/27/2012   Procedure: REMOVAL GANGLION OF WRIST;  Surgeon: Wyn Forsterobert V Sypher Jr., MD;  Location: [facility];  Service: Orthopedics;  Laterality: Left;  EXCISION BIOPSY LEFT WRIST DORSAL GANGLION CYST  . JOINT REPLACEMENT     7  knee procedures  . KNEE ARTHROSCOPY     LT knee sx x6  . 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 TOTAL KNEE ARTHROPLASTY Left 03/29/2013   Procedure: TOTAL KNEE ARTHROPLASTY;  Surgeon: Nilda Simmerobert A Wainer, MD;  Location: [facility];  Service: Orthopedics;  Laterality: Left;    reports that she has never smoked. She has never used smokeless tobacco. She reports that she drinks alcohol. She reports that she does not use drugs. family history includes Arthritis in her mother; Cancer in her paternal grandmother; Diabetes Mellitus II in her mother; Heart attack (age of onset: 3565) in her father; Hypertension in her father and maternal grandmother. No Known Allergies Current Outpatient Prescriptions on File Prior to Visit  Medication Sig Dispense Refill  .  celecoxib (CELEBREX) 200 MG capsule Take 200 mg by mouth 2 (two) times daily.     No current facility-administered medications on file prior to visit.    Review of Systems  All otherwise neg per pt     Objective:   Physical Exam BP 130/70   Pulse (!) 102   Temp 98.7 F (37.1 C) (Oral)   Resp 20   Wt 174 lb (78.9 kg)   SpO2 97%   BMI 25.70 kg/m  VS noted, mild ill Constitutional: Pt appears in no apparent distress HENT: Head: NCAT.  Right Ear: External ear normal.  Left Ear: External ear normal.  Eyes: . Pupils are equal, round, and reactive to light. Conjunctivae and EOM are normal Bilat tm's with mild erythema.  Max sinus areas mild tender.  Pharynx with mild erythema, no exudate Neck: Normal range of motion. Neck supple.  Cardiovascular: Normal rate and regular rhythm.   Pulmonary/Chest: Effort normal and breath sounds without rales or wheezing.  Neurological: Pt is alert. Not confused , motor grossly intact Skin: Skin is warm. No rash, no LE edema Psychiatric: Pt behavior is normal. No agitation.  All other system neg per pt    Assessment & Plan:

## 2016-11-08 NOTE — Assessment & Plan Note (Signed)
Mild to mod, for antibx course,  to f/u any worsening symptoms or concerns 

## 2016-11-08 NOTE — Patient Instructions (Signed)
Please take all new medication as prescribed - the antibiotic, and cough medicine  Please continue all other medications as before, and refills have been done if requested.  Please have the pharmacy call with any other refills you may need.  Please keep your appointments with your specialists as you may have planned      

## 2016-11-08 NOTE — Assessment & Plan Note (Signed)
Improving, very low suspicion for facial fx, would hold on films now,  to f/u any worsening symptoms or concerns

## 2016-12-02 DIAGNOSIS — M7712 Lateral epicondylitis, left elbow: Secondary | ICD-10-CM | POA: Diagnosis not present

## 2016-12-04 DIAGNOSIS — M7712 Lateral epicondylitis, left elbow: Secondary | ICD-10-CM | POA: Diagnosis not present

## 2016-12-04 DIAGNOSIS — M25522 Pain in left elbow: Secondary | ICD-10-CM | POA: Diagnosis not present

## 2016-12-04 DIAGNOSIS — M6281 Muscle weakness (generalized): Secondary | ICD-10-CM | POA: Diagnosis not present

## 2016-12-05 ENCOUNTER — Other Ambulatory Visit (HOSPITAL_COMMUNITY)
Admission: RE | Admit: 2016-12-05 | Discharge: 2016-12-05 | Disposition: A | Discharge status: Home / Self Care | Payer: BLUE CROSS/BLUE SHIELD | Source: Ambulatory Visit | Attending: Internal Medicine | Admitting: Internal Medicine

## 2016-12-05 ENCOUNTER — Other Ambulatory Visit: Payer: BLUE CROSS/BLUE SHIELD

## 2016-12-05 ENCOUNTER — Ambulatory Visit (INDEPENDENT_AMBULATORY_CARE_PROVIDER_SITE_OTHER): Payer: BLUE CROSS/BLUE SHIELD | Admitting: Internal Medicine

## 2016-12-05 ENCOUNTER — Other Ambulatory Visit (INDEPENDENT_AMBULATORY_CARE_PROVIDER_SITE_OTHER): Payer: BLUE CROSS/BLUE SHIELD

## 2016-12-05 ENCOUNTER — Encounter: Payer: Self-pay | Admitting: Internal Medicine

## 2016-12-05 VITALS — BP 100/62 | HR 56 | Temp 98.2°F | Ht 69.0 in | Wt 170.0 lb

## 2016-12-05 DIAGNOSIS — Z124 Encounter for screening for malignant neoplasm of cervix: Secondary | ICD-10-CM

## 2016-12-05 DIAGNOSIS — Z1151 Encounter for screening for human papillomavirus (HPV): Secondary | ICD-10-CM | POA: Diagnosis not present

## 2016-12-05 DIAGNOSIS — Z0000 Encounter for general adult medical examination without abnormal findings: Secondary | ICD-10-CM

## 2016-12-05 DIAGNOSIS — Z01419 Encounter for gynecological examination (general) (routine) without abnormal findings: Secondary | ICD-10-CM | POA: Diagnosis not present

## 2016-12-05 LAB — LIPID PANEL
CHOL/HDL RATIO: 2
CHOLESTEROL: 128 mg/dL (ref 0–200)
HDL: 54.2 mg/dL (ref >39.00)
LDL Cholesterol: 59 mg/dL (ref 0–99)
NonHDL: 73.91
TRIGLYCERIDES: 77 mg/dL (ref 0.0–149.0)
VLDL: 15.4 mg/dL (ref 0.0–40.0)

## 2016-12-05 LAB — CBC
HCT: 39.1 % (ref 36.0–46.0)
Hemoglobin: 13.2 g/dL (ref 12.0–15.0)
MCHC: 33.7 g/dL (ref 30.0–36.0)
MCV: 91.8 fl (ref 78.0–100.0)
PLATELETS: 232 10*3/uL (ref 150.0–400.0)
RBC: 4.26 Mil/uL (ref 3.87–5.11)
RDW: 13.3 % (ref 11.5–15.5)
WBC: 6.5 10*3/uL (ref 4.0–10.5)

## 2016-12-05 LAB — COMPREHENSIVE METABOLIC PANEL
ALBUMIN: 4.2 g/dL (ref 3.5–5.2)
ALK PHOS: 39 U/L (ref 39–117)
ALT: 10 U/L (ref 0–35)
AST: 16 U/L (ref 0–37)
BILIRUBIN TOTAL: 0.6 mg/dL (ref 0.2–1.2)
BUN: 12 mg/dL (ref 6–23)
CALCIUM: 8.9 mg/dL (ref 8.4–10.5)
CO2: 26 meq/L (ref 19–32)
CREATININE: 0.78 mg/dL (ref 0.40–1.20)
Chloride: 107 mEq/L (ref 96–112)
GFR: 83.89 mL/min (ref >60.00)
Glucose, Bld: 97 mg/dL (ref 70–99)
Potassium: 3.9 mEq/L (ref 3.5–5.1)
Sodium: 139 mEq/L (ref 135–145)
TOTAL PROTEIN: 6.7 g/dL (ref 6.0–8.3)

## 2016-12-05 LAB — TSH: TSH: 1.41 u[IU]/mL (ref 0.35–4.50)

## 2016-12-05 NOTE — Assessment & Plan Note (Signed)
Pap smear collected, checking labs, flu shot done. Tetanus up to date. Getting mammogram this month. Counseled about sun safety and dangers of distracted driving. Given screening recommendations.

## 2016-12-05 NOTE — Progress Notes (Signed)
   Subjective:    Patient ID: Rebecca Morrow, female    DOB: 01-Sep-1971, [age] y.o.   MRN: [id number]  HPI The patient is a 46 YO female coming in for wellness. No new concerns.   [facility], [facility], social history reviewed and updated.   Review of Systems  Constitutional: Negative.   HENT: Negative.   Eyes: Negative.   Respiratory: Negative for cough, chest tightness and shortness of breath.   Cardiovascular: Negative for chest pain, palpitations and leg swelling.  Gastrointestinal: Negative for abdominal distention, abdominal pain, constipation, diarrhea, nausea and vomiting.  Musculoskeletal: Negative.   Skin: Negative.   Neurological: Negative.   Psychiatric/Behavioral: Negative.       Objective:   Physical Exam  Constitutional: She is oriented to person, place, and time. She appears well-developed and well-nourished.  HENT:  Head: Normocephalic and atraumatic.  Eyes: EOM are normal.  Neck: Normal range of motion.  Cardiovascular: Normal rate and regular rhythm.   Pulmonary/Chest: Effort normal and breath sounds normal. No respiratory distress. She has no wheezes. She has no rales.  Abdominal: Soft. Bowel sounds are normal. She exhibits no distension. There is no tenderness. There is no rebound.  Genitourinary: Vagina normal and uterus normal.  Genitourinary Comments: Pap smear collected  Musculoskeletal: She exhibits no edema.  Neurological: She is alert and oriented to person, place, and time. Coordination normal.  Skin: Skin is warm and dry.  Psychiatric: She has a normal mood and affect.   Vitals:   12/05/16 0853  BP: 100/62  Pulse: (!) 56  Temp: 98.2 F (36.8 C)  TempSrc: Oral  SpO2: 99%  Weight: 170 lb (77.1 kg)  Height: 5\' 9"  (1.753 m)      Assessment & Plan:

## 2016-12-05 NOTE — Progress Notes (Signed)
Pre visit review using our clinic review tool, if applicable. No additional management support is needed unless otherwise documented below in the visit note. 

## 2016-12-05 NOTE — Patient Instructions (Signed)
We have done the pap smear today.   We will check the labs and send the results to mychart.   Health Maintenance, Female Introduction Adopting a healthy lifestyle and getting preventive care can go a long way to promote health and wellness. Talk with your health care provider about what schedule of regular examinations is right for you. This is a good chance for you to check in with your provider about disease prevention and staying healthy. In between checkups, there are plenty of things you can do on your own. Experts have done a lot of research about which lifestyle changes and preventive measures are most likely to keep you healthy. Ask your health care provider for more information. Weight and diet Eat a healthy diet  Be sure to include plenty of vegetables, fruits, low-fat dairy products, and lean protein.  Do not eat a lot of foods high in solid fats, added sugars, or salt.  Get regular exercise. This is one of the most important things you can do for your health.  Most adults should exercise for at least 150 minutes each week. The exercise should increase your heart rate and make you sweat (moderate-intensity exercise).  Most adults should also do strengthening exercises at least twice a week. This is in addition to the moderate-intensity exercise. Maintain a healthy weight  Body mass index (BMI) is a measurement that can be used to identify possible weight problems. It estimates body fat based on height and weight. Your health care provider can help determine your BMI and help you achieve or maintain a healthy weight.  For females 48 years of age and older:  A BMI below 18.5 is considered underweight.  A BMI of 18.5 to 24.9 is normal.  A BMI of 25 to 29.9 is considered overweight.  A BMI of 30 and above is considered obese. Watch levels of cholesterol and blood lipids  You should start having your blood tested for lipids and cholesterol at 48 years of age, then have this  test every 5 years. your blood tested for lipids and cholesterol at 48 years of age, then have this  test every 5 years.  You may need to have your cholesterol levels checked more often if:  Your lipid or cholesterol levels are high.  You are older than 48 years of age.  You are at high risk for heart disease. Cancer screening Lung Cancer  Lung cancer screening is recommended for adults 48-9 years old who are at high risk for lung cancer because of a history of smoking.  A yearly low-dose CT scan of the lungs is recommended for people who:  Currently smoke.  Have quit within the past 15 years.  Have at least a 30-pack-year history of smoking. A pack year is smoking an average of one pack of cigarettes a day for 1 year.  Yearly screening should continue until it has been 15 years since you quit.  Yearly screening should stop if you develop a health problem that would prevent you from having lung cancer treatment. Breast Cancer  Practice breast self-awareness. This means understanding how your breasts normally appear and feel.  It also means doing regular breast self-exams. Let your health care provider know about any changes, no matter how small.  If you are in your 20s or 30s, you should have a clinical breast exam (CBE) by a health care provider every 1-3 years as part of a regular health exam.  If you are 23 or older, have a CBE every year. Also consider having a breast X-ray (mammogram) every year.  If you have a family history  of breast cancer, talk to your health care provider about genetic screening.  If you are at high risk for breast cancer, talk to your health care provider about having an MRI and a mammogram every year.  Breast cancer gene (BRCA) assessment is recommended for women who have family members with BRCA-related cancers. BRCA-related cancers include:  Breast.  Ovarian.  Tubal.  Peritoneal cancers.  Results of the assessment will determine the need for genetic counseling and BRCA1 and BRCA2 testing. Cervical Cancer  Your health care provider  may recommend that you be screened regularly for cancer of the pelvic organs (ovaries, uterus, and vagina). This screening involves a pelvic examination, including checking for microscopic changes to the surface of your cervix (Pap test). You may be encouraged to have this screening done every 3 years, beginning at age 64.  For women ages 59-65, health care providers may recommend pelvic exams and Pap testing every 3 years, or they may recommend the Pap and pelvic exam, combined with testing for human papilloma virus (HPV), every 5 years. Some types of HPV increase your risk of cervical cancer. Testing for HPV may also be done on women of any age with unclear Pap test results.  Other health care providers may not recommend any screening for nonpregnant women who are considered low risk for pelvic cancer and who do not have symptoms. Ask your health care provider if a screening pelvic exam is right for you.  If you have had past treatment for cervical cancer or a condition that could lead to cancer, you need Pap tests and screening for cancer for at least 20 years after your treatment. If Pap tests have been discontinued, your risk factors (such as having a new sexual partner) need to be reassessed to determine if screening should resume. Some women have medical problems that increase the chance of getting cervical cancer. In these cases, your health care provider may recommend more frequent screening and Pap tests. Colorectal Cancer  This type of cancer can be detected and often prevented.  Routine colorectal cancer screening usually begins at 48 years of age and continues through 48 years of age.  Your health care provider may recommend screening at an earlier age if you have risk factors for colon cancer.  Your health care provider may also recommend using home test kits to check for hidden blood in the stool.  A small camera at the end of a tube can be used to examine your colon directly  (sigmoidoscopy or colonoscopy). This is done to check for the earliest forms of colorectal cancer.  Routine screening usually begins at age 36.  Direct examination of the colon should be repeated every 5-10 years through 48 years of age. However, you may need to be screened more often if early forms of precancerous polyps or small growths are found. Skin Cancer  Check your skin from head to toe regularly.  Tell your health care provider about any new moles or changes in moles, especially if there is a change in a mole's shape or color.  Also tell your health care provider if you have a mole that is larger than the size of a pencil eraser.  Always use sunscreen. Apply sunscreen liberally and repeatedly throughout the day.  Protect yourself by wearing long sleeves, pants, a wide-brimmed hat, and sunglasses whenever you are outside. Heart disease, diabetes, and high blood pressure  High blood pressure causes heart disease and increases the risk of stroke. High blood pressure is  more likely to develop in:  People who have blood pressure in the high end of the normal range (130-139/85-89 mm Hg).  People who are overweight or obese.  People who are African American.  If you are 89-7 years of age, have your blood pressure checked every 3-5 years. If you are 41 years of age or older, have your blood pressure checked every year. You should have your blood pressure measured twice-once when you are at a hospital or clinic, and once when you are not at a hospital or clinic. Record the average of the two measurements. To check your blood pressure when you are not at a hospital or clinic, you can use:  An automated blood pressure machine at a pharmacy.  A home blood pressure monitor.  If you are between 34 years and 42 years old, ask your health care provider if you should take aspirin to prevent strokes.  Have regular diabetes screenings. This involves taking a blood sample to check your  fasting blood sugar level.  If you are at a normal weight and have a low risk for diabetes, have this test once every three years after 48 years of age.  If you are overweight and have a high risk for diabetes, consider being tested at a younger age or more often. Preventing infection Hepatitis B  If you have a higher risk for hepatitis B, you should be screened for this virus. You are considered at high risk for hepatitis B if:  You were born in a country where hepatitis B is common. Ask your health care provider which countries are considered high risk.  Your parents were born in a high-risk country, and you have not been immunized against hepatitis B (hepatitis B vaccine).  You have HIV or AIDS.  You use needles to inject street drugs.  You live with someone who has hepatitis B.  You have had sex with someone who has hepatitis B.  You get hemodialysis treatment.  You take certain medicines for conditions, including cancer, organ transplantation, and autoimmune conditions. Hepatitis C  Blood testing is recommended for:  Everyone born from [birth year] through 1967.  Anyone with known risk factors for hepatitis C. Sexually transmitted infections (STIs)  You should be screened for sexually transmitted infections (STIs) including gonorrhea and chlamydia if:  You are sexually active and are younger than 48 years of age.  You are older than 48 years of age and your health care provider tells you that you are at risk for this type of infection.  Your sexual activity has changed since you were last screened and you are at an increased risk for chlamydia or gonorrhea. Ask your health care provider if you are at risk.  If you do not have HIV, but are at risk, it may be recommended that you take a prescription medicine daily to prevent HIV infection. This is called pre-exposure prophylaxis (PrEP). You are considered at risk if:  You are sexually active and do not regularly use condoms or  know the HIV status of your partner(s).  You take drugs by injection.  You are sexually active with a partner who has HIV. Talk with your health care provider about whether you are at high risk of being infected with HIV. If you choose to begin PrEP, you should first be tested for HIV. You should then be tested every 3 months for as long as you are taking PrEP. Pregnancy  If you are premenopausal and you may become pregnant,  ask your health care provider about preconception counseling.  If you may become pregnant, take 400 to 800 micrograms (mcg) of folic acid every day.  If you want to prevent pregnancy, talk to your health care provider about birth control (contraception). Osteoporosis and menopause  Osteoporosis is a disease in which the bones lose minerals and strength with aging. This can result in serious bone fractures. Your risk for osteoporosis can be identified using a bone density scan.  If you are 51 years of age or older, or if you are at risk for osteoporosis and fractures, ask your health care provider if you should be screened.  Ask your health care provider whether you should take a calcium or vitamin D supplement to lower your risk for osteoporosis.  Menopause may have certain physical symptoms and risks.  Hormone replacement therapy may reduce some of these symptoms and risks. Talk to your health care provider about whether hormone replacement therapy is right for you. Follow these instructions at home:  Schedule regular health, dental, and eye exams.  Stay current with your immunizations.  Do not use any tobacco products including cigarettes, chewing tobacco, or electronic cigarettes.  If you are pregnant, do not drink alcohol.  If you are breastfeeding, limit how much and how often you drink alcohol.  Limit alcohol intake to no more than 1 drink per day for nonpregnant women. One drink equals 12 ounces of beer, 5 ounces of wine, or 1 ounces of hard  liquor.  Do not use street drugs.  Do not share needles.  Ask your health care provider for help if you need support or information about quitting drugs.  Tell your health care provider if you often feel depressed.  Tell your health care provider if you have ever been abused or do not feel safe at home. This information is not intended to replace advice given to you by your health care provider. Make sure you discuss any questions you have with your health care provider. Document Released: 04/29/2011 Document Revised: 03/21/2016 Document Reviewed: 07/18/2015  2017 [employer]vier

## 2016-12-10 LAB — CYTOLOGY - PAP
ADEQUACY: ABSENT
Diagnosis: NEGATIVE
HPV: NOT DETECTED

## 2016-12-16 DIAGNOSIS — M25522 Pain in left elbow: Secondary | ICD-10-CM | POA: Diagnosis not present

## 2016-12-16 DIAGNOSIS — M6281 Muscle weakness (generalized): Secondary | ICD-10-CM | POA: Diagnosis not present

## 2016-12-16 DIAGNOSIS — M7712 Lateral epicondylitis, left elbow: Secondary | ICD-10-CM | POA: Diagnosis not present

## 2016-12-18 ENCOUNTER — Other Ambulatory Visit: Payer: Self-pay | Admitting: Internal Medicine

## 2016-12-18 DIAGNOSIS — Z1231 Encounter for screening mammogram for malignant neoplasm of breast: Secondary | ICD-10-CM

## 2016-12-30 DIAGNOSIS — M7712 Lateral epicondylitis, left elbow: Secondary | ICD-10-CM | POA: Diagnosis not present

## 2016-12-30 DIAGNOSIS — M6281 Muscle weakness (generalized): Secondary | ICD-10-CM | POA: Diagnosis not present

## 2016-12-30 DIAGNOSIS — M25522 Pain in left elbow: Secondary | ICD-10-CM | POA: Diagnosis not present

## 2016-12-31 ENCOUNTER — Ambulatory Visit
Admission: RE | Admit: 2016-12-31 | Discharge: 2016-12-31 | Disposition: A | Discharge status: Home / Self Care | Payer: BLUE CROSS/BLUE SHIELD | Source: Ambulatory Visit | Attending: Internal Medicine | Admitting: Internal Medicine

## 2016-12-31 DIAGNOSIS — Z1231 Encounter for screening mammogram for malignant neoplasm of breast: Secondary | ICD-10-CM | POA: Diagnosis not present

## 2017-01-30 DIAGNOSIS — M7711 Lateral epicondylitis, right elbow: Secondary | ICD-10-CM | POA: Diagnosis not present

## 2017-01-30 DIAGNOSIS — M6281 Muscle weakness (generalized): Secondary | ICD-10-CM | POA: Diagnosis not present

## 2017-01-30 DIAGNOSIS — M25522 Pain in left elbow: Secondary | ICD-10-CM | POA: Diagnosis not present

## 2017-01-31 DIAGNOSIS — L728 Other follicular cysts of the skin and subcutaneous tissue: Secondary | ICD-10-CM | POA: Diagnosis not present

## 2017-01-31 DIAGNOSIS — D229 Melanocytic nevi, unspecified: Secondary | ICD-10-CM | POA: Diagnosis not present

## 2017-01-31 DIAGNOSIS — L821 Other seborrheic keratosis: Secondary | ICD-10-CM | POA: Diagnosis not present

## 2017-03-26 DIAGNOSIS — M7711 Lateral epicondylitis, right elbow: Secondary | ICD-10-CM | POA: Diagnosis not present

## 2017-03-26 DIAGNOSIS — M7712 Lateral epicondylitis, left elbow: Secondary | ICD-10-CM | POA: Diagnosis not present

## 2017-03-27 DIAGNOSIS — M7712 Lateral epicondylitis, left elbow: Secondary | ICD-10-CM | POA: Diagnosis not present

## 2017-03-27 DIAGNOSIS — M6281 Muscle weakness (generalized): Secondary | ICD-10-CM | POA: Diagnosis not present

## 2017-03-27 DIAGNOSIS — M25522 Pain in left elbow: Secondary | ICD-10-CM | POA: Diagnosis not present

## 2017-04-04 DIAGNOSIS — M7712 Lateral epicondylitis, left elbow: Secondary | ICD-10-CM | POA: Diagnosis not present

## 2017-04-04 DIAGNOSIS — M25522 Pain in left elbow: Secondary | ICD-10-CM | POA: Diagnosis not present

## 2017-04-04 DIAGNOSIS — M6281 Muscle weakness (generalized): Secondary | ICD-10-CM | POA: Diagnosis not present

## 2017-04-08 DIAGNOSIS — M6281 Muscle weakness (generalized): Secondary | ICD-10-CM | POA: Diagnosis not present

## 2017-04-08 DIAGNOSIS — M7712 Lateral epicondylitis, left elbow: Secondary | ICD-10-CM | POA: Diagnosis not present

## 2017-04-08 DIAGNOSIS — M25522 Pain in left elbow: Secondary | ICD-10-CM | POA: Diagnosis not present

## 2017-04-28 DIAGNOSIS — M7712 Lateral epicondylitis, left elbow: Secondary | ICD-10-CM | POA: Diagnosis not present

## 2017-04-28 DIAGNOSIS — M6281 Muscle weakness (generalized): Secondary | ICD-10-CM | POA: Diagnosis not present

## 2017-04-28 DIAGNOSIS — M25522 Pain in left elbow: Secondary | ICD-10-CM | POA: Diagnosis not present

## 2017-05-05 DIAGNOSIS — M6281 Muscle weakness (generalized): Secondary | ICD-10-CM | POA: Diagnosis not present

## 2017-05-05 DIAGNOSIS — M25522 Pain in left elbow: Secondary | ICD-10-CM | POA: Diagnosis not present

## 2017-05-05 DIAGNOSIS — M7712 Lateral epicondylitis, left elbow: Secondary | ICD-10-CM | POA: Diagnosis not present

## 2017-05-12 DIAGNOSIS — M7712 Lateral epicondylitis, left elbow: Secondary | ICD-10-CM | POA: Diagnosis not present

## 2017-05-12 DIAGNOSIS — M6281 Muscle weakness (generalized): Secondary | ICD-10-CM | POA: Diagnosis not present

## 2017-05-12 DIAGNOSIS — M25522 Pain in left elbow: Secondary | ICD-10-CM | POA: Diagnosis not present

## 2017-05-19 DIAGNOSIS — M7712 Lateral epicondylitis, left elbow: Secondary | ICD-10-CM | POA: Diagnosis not present

## 2017-05-19 DIAGNOSIS — M25522 Pain in left elbow: Secondary | ICD-10-CM | POA: Diagnosis not present

## 2017-05-19 DIAGNOSIS — M6281 Muscle weakness (generalized): Secondary | ICD-10-CM | POA: Diagnosis not present

## 2017-08-28 DIAGNOSIS — Z96652 Presence of left artificial knee joint: Secondary | ICD-10-CM | POA: Diagnosis not present

## 2017-08-28 DIAGNOSIS — S83281A Other tear of lateral meniscus, current injury, right knee, initial encounter: Secondary | ICD-10-CM | POA: Diagnosis not present

## 2017-09-29 DIAGNOSIS — M25562 Pain in left knee: Secondary | ICD-10-CM | POA: Diagnosis not present

## 2017-11-03 DIAGNOSIS — M1711 Unilateral primary osteoarthritis, right knee: Secondary | ICD-10-CM | POA: Diagnosis not present

## 2017-11-10 DIAGNOSIS — M1711 Unilateral primary osteoarthritis, right knee: Secondary | ICD-10-CM | POA: Diagnosis not present

## 2017-11-17 DIAGNOSIS — M1712 Unilateral primary osteoarthritis, left knee: Secondary | ICD-10-CM | POA: Diagnosis not present

## 2017-11-17 DIAGNOSIS — S93491A Sprain of other ligament of right ankle, initial encounter: Secondary | ICD-10-CM | POA: Diagnosis not present

## 2017-12-12 ENCOUNTER — Encounter: Payer: Self-pay | Admitting: Internal Medicine

## 2017-12-12 ENCOUNTER — Ambulatory Visit (INDEPENDENT_AMBULATORY_CARE_PROVIDER_SITE_OTHER): Payer: BLUE CROSS/BLUE SHIELD | Admitting: Internal Medicine

## 2017-12-12 ENCOUNTER — Other Ambulatory Visit (INDEPENDENT_AMBULATORY_CARE_PROVIDER_SITE_OTHER): Payer: BLUE CROSS/BLUE SHIELD

## 2017-12-12 VITALS — BP 116/78 | HR 63 | Temp 98.1°F | Ht 69.0 in | Wt 176.0 lb

## 2017-12-12 DIAGNOSIS — Z0000 Encounter for general adult medical examination without abnormal findings: Secondary | ICD-10-CM | POA: Diagnosis not present

## 2017-12-12 LAB — COMPREHENSIVE METABOLIC PANEL
ALT: 10 U/L (ref 0–35)
AST: 16 U/L (ref 0–37)
Albumin: 4.5 g/dL (ref 3.5–5.2)
Alkaline Phosphatase: 38 U/L — ABNORMAL LOW (ref 39–117)
BUN: 18 mg/dL (ref 6–23)
CHLORIDE: 105 meq/L (ref 96–112)
CO2: 25 mEq/L (ref 19–32)
Calcium: 9 mg/dL (ref 8.4–10.5)
Creatinine, Ser: 0.8 mg/dL (ref 0.40–1.20)
GFR: 81.12 mL/min (ref >60.00)
GLUCOSE: 102 mg/dL — AB (ref 70–99)
POTASSIUM: 4.1 meq/L (ref 3.5–5.1)
Sodium: 138 mEq/L (ref 135–145)
Total Bilirubin: 0.6 mg/dL (ref 0.2–1.2)
Total Protein: 7.1 g/dL (ref 6.0–8.3)

## 2017-12-12 LAB — LIPID PANEL
CHOLESTEROL: 146 mg/dL (ref 0–200)
HDL: 58 mg/dL (ref >39.00)
LDL CALC: 75 mg/dL (ref 0–99)
NonHDL: 88.03
Total CHOL/HDL Ratio: 3
Triglycerides: 65 mg/dL (ref 0.0–149.0)
VLDL: 13 mg/dL (ref 0.0–40.0)

## 2017-12-12 LAB — CBC
HEMATOCRIT: 41 % (ref 36.0–46.0)
Hemoglobin: 13.9 g/dL (ref 12.0–15.0)
MCHC: 33.9 g/dL (ref 30.0–36.0)
MCV: 92.4 fl (ref 78.0–100.0)
Platelets: 254 10*3/uL (ref 150.0–400.0)
RBC: 4.43 Mil/uL (ref 3.87–5.11)
RDW: 13.2 % (ref 11.5–15.5)
WBC: 5.4 10*3/uL (ref 4.0–10.5)

## 2017-12-12 LAB — VITAMIN D 25 HYDROXY (VIT D DEFICIENCY, FRACTURES): VITD: 21.21 ng/mL — ABNORMAL LOW (ref 30.00–100.00)

## 2017-12-12 LAB — VITAMIN B12: VITAMIN B 12: 639 pg/mL (ref 211–911)

## 2017-12-12 LAB — TSH: TSH: 2.2 u[IU]/mL (ref 0.35–4.50)

## 2017-12-12 LAB — T4, FREE: FREE T4: 0.94 ng/dL (ref 0.60–1.60)

## 2017-12-12 NOTE — Assessment & Plan Note (Signed)
Pap smear up to date, mammogram up to date. Flu and tetanus up to date. Counseled about sun safety and mole surveillance. Counseled about dangers of distracted driving. Given screening recommendations.

## 2017-12-12 NOTE — Patient Instructions (Signed)

## 2017-12-12 NOTE — Progress Notes (Signed)
   Subjective:    Patient ID: Rebecca Morrow, female    DOB: 09-17-1971, 47 y.o.   MRN: [id number]  HPI The patient is a 47 YO female coming in for physical.   PMH, [facility], social history reviewed and updated.   Review of Systems  Constitutional: Negative.   HENT: Negative.   Eyes: Negative.   Respiratory: Negative for cough, chest tightness and shortness of breath.   Cardiovascular: Negative for chest pain, palpitations and leg swelling.  Gastrointestinal: Negative for abdominal distention, abdominal pain, constipation, diarrhea, nausea and vomiting.  Musculoskeletal: Negative.   Skin: Negative.   Neurological: Negative.   Psychiatric/Behavioral: Negative.       Objective:   Physical Exam  Constitutional: She is oriented to person, place, and time. She appears well-developed and well-nourished.  HENT:  Head: Normocephalic and atraumatic.  Eyes: EOM are normal.  Neck: Normal range of motion.  Cardiovascular: Normal rate and regular rhythm.  Pulmonary/Chest: Effort normal and breath sounds normal. No respiratory distress. She has no wheezes. She has no rales.  Abdominal: Soft. Bowel sounds are normal. She exhibits no distension. There is no tenderness. There is no rebound.  Musculoskeletal: She exhibits no edema.  Neurological: She is alert and oriented to person, place, and time. Coordination normal.  Skin: Skin is warm and dry.  Psychiatric: She has a normal mood and affect.   Vitals:   12/12/17 0806  BP: 116/78  Pulse: 63  Temp: 98.1 F (36.7 C)  TempSrc: Oral  SpO2: 98%  Weight: 176 lb (79.8 kg)  Height: 5\' 9"  (1.753 m)      Assessment & Plan:

## 2017-12-23 ENCOUNTER — Other Ambulatory Visit: Payer: Self-pay | Admitting: Internal Medicine

## 2017-12-23 DIAGNOSIS — Z1231 Encounter for screening mammogram for malignant neoplasm of breast: Secondary | ICD-10-CM

## 2018-01-05 DIAGNOSIS — M1712 Unilateral primary osteoarthritis, left knee: Secondary | ICD-10-CM | POA: Diagnosis not present

## 2018-01-23 ENCOUNTER — Ambulatory Visit: Payer: BLUE CROSS/BLUE SHIELD

## 2018-01-23 DIAGNOSIS — M659 Synovitis and tenosynovitis, unspecified: Secondary | ICD-10-CM | POA: Diagnosis not present

## 2018-01-23 DIAGNOSIS — M23231 Derangement of other medial meniscus due to old tear or injury, right knee: Secondary | ICD-10-CM | POA: Diagnosis not present

## 2018-01-23 DIAGNOSIS — S83241A Other tear of medial meniscus, current injury, right knee, initial encounter: Secondary | ICD-10-CM | POA: Diagnosis not present

## 2018-01-23 DIAGNOSIS — M94261 Chondromalacia, right knee: Secondary | ICD-10-CM | POA: Diagnosis not present

## 2018-01-23 DIAGNOSIS — G8918 Other acute postprocedural pain: Secondary | ICD-10-CM | POA: Diagnosis not present

## 2018-01-23 DIAGNOSIS — M6751 Plica syndrome, right knee: Secondary | ICD-10-CM | POA: Diagnosis not present

## 2018-02-02 DIAGNOSIS — M6751 Plica syndrome, right knee: Secondary | ICD-10-CM | POA: Diagnosis not present

## 2018-02-05 DIAGNOSIS — S83241D Other tear of medial meniscus, current injury, right knee, subsequent encounter: Secondary | ICD-10-CM | POA: Diagnosis not present

## 2018-02-05 DIAGNOSIS — M25561 Pain in right knee: Secondary | ICD-10-CM | POA: Diagnosis not present

## 2018-02-05 DIAGNOSIS — M94261 Chondromalacia, right knee: Secondary | ICD-10-CM | POA: Diagnosis not present

## 2018-02-10 DIAGNOSIS — M94261 Chondromalacia, right knee: Secondary | ICD-10-CM | POA: Diagnosis not present

## 2018-02-10 DIAGNOSIS — M25561 Pain in right knee: Secondary | ICD-10-CM | POA: Diagnosis not present

## 2018-02-10 DIAGNOSIS — S83241D Other tear of medial meniscus, current injury, right knee, subsequent encounter: Secondary | ICD-10-CM | POA: Diagnosis not present

## 2018-02-11 DIAGNOSIS — M94261 Chondromalacia, right knee: Secondary | ICD-10-CM | POA: Diagnosis not present

## 2018-02-11 DIAGNOSIS — M25561 Pain in right knee: Secondary | ICD-10-CM | POA: Diagnosis not present

## 2018-02-11 DIAGNOSIS — S83241D Other tear of medial meniscus, current injury, right knee, subsequent encounter: Secondary | ICD-10-CM | POA: Diagnosis not present

## 2018-02-17 DIAGNOSIS — M94261 Chondromalacia, right knee: Secondary | ICD-10-CM | POA: Diagnosis not present

## 2018-02-17 DIAGNOSIS — S83241D Other tear of medial meniscus, current injury, right knee, subsequent encounter: Secondary | ICD-10-CM | POA: Diagnosis not present

## 2018-02-17 DIAGNOSIS — M25561 Pain in right knee: Secondary | ICD-10-CM | POA: Diagnosis not present

## 2018-02-18 ENCOUNTER — Ambulatory Visit
Admission: RE | Admit: 2018-02-18 | Discharge: 2018-02-18 | Disposition: A | Discharge status: Home / Self Care | Payer: BLUE CROSS/BLUE SHIELD | Source: Ambulatory Visit | Attending: Internal Medicine | Admitting: Internal Medicine

## 2018-02-18 DIAGNOSIS — Z1231 Encounter for screening mammogram for malignant neoplasm of breast: Secondary | ICD-10-CM | POA: Diagnosis not present

## 2018-02-19 ENCOUNTER — Other Ambulatory Visit: Payer: Self-pay | Admitting: Internal Medicine

## 2018-02-19 DIAGNOSIS — M25561 Pain in right knee: Secondary | ICD-10-CM | POA: Diagnosis not present

## 2018-02-19 DIAGNOSIS — S83241D Other tear of medial meniscus, current injury, right knee, subsequent encounter: Secondary | ICD-10-CM | POA: Diagnosis not present

## 2018-02-19 DIAGNOSIS — R928 Other abnormal and inconclusive findings on diagnostic imaging of breast: Secondary | ICD-10-CM

## 2018-02-19 DIAGNOSIS — M94261 Chondromalacia, right knee: Secondary | ICD-10-CM | POA: Diagnosis not present

## 2018-02-23 ENCOUNTER — Ambulatory Visit
Admission: RE | Admit: 2018-02-23 | Discharge: 2018-02-23 | Disposition: A | Discharge status: Home / Self Care | Payer: BLUE CROSS/BLUE SHIELD | Source: Ambulatory Visit | Attending: Internal Medicine | Admitting: Internal Medicine

## 2018-02-23 DIAGNOSIS — R928 Other abnormal and inconclusive findings on diagnostic imaging of breast: Secondary | ICD-10-CM

## 2018-02-23 DIAGNOSIS — R922 Inconclusive mammogram: Secondary | ICD-10-CM | POA: Diagnosis not present

## 2018-02-23 DIAGNOSIS — N6012 Diffuse cystic mastopathy of left breast: Secondary | ICD-10-CM | POA: Diagnosis not present

## 2018-03-19 DIAGNOSIS — M67432 Ganglion, left wrist: Secondary | ICD-10-CM | POA: Diagnosis not present

## 2018-03-25 ENCOUNTER — Other Ambulatory Visit: Payer: Self-pay

## 2018-03-25 DIAGNOSIS — M67432 Ganglion, left wrist: Secondary | ICD-10-CM | POA: Diagnosis not present

## 2018-03-30 DIAGNOSIS — M67432 Ganglion, left wrist: Secondary | ICD-10-CM | POA: Diagnosis not present

## 2018-07-30 DIAGNOSIS — S43432A Superior glenoid labrum lesion of left shoulder, initial encounter: Secondary | ICD-10-CM | POA: Diagnosis not present

## 2018-07-30 DIAGNOSIS — M7711 Lateral epicondylitis, right elbow: Secondary | ICD-10-CM | POA: Diagnosis not present

## 2018-08-04 DIAGNOSIS — M25551 Pain in right hip: Secondary | ICD-10-CM | POA: Diagnosis not present

## 2018-08-06 DIAGNOSIS — M7711 Lateral epicondylitis, right elbow: Secondary | ICD-10-CM | POA: Diagnosis not present

## 2018-08-31 DIAGNOSIS — S43432D Superior glenoid labrum lesion of left shoulder, subsequent encounter: Secondary | ICD-10-CM | POA: Diagnosis not present

## 2018-09-11 DIAGNOSIS — M25512 Pain in left shoulder: Secondary | ICD-10-CM | POA: Diagnosis not present

## 2018-10-09 DIAGNOSIS — M24112 Other articular cartilage disorders, left shoulder: Secondary | ICD-10-CM | POA: Diagnosis not present

## 2018-10-09 DIAGNOSIS — Y999 Unspecified external cause status: Secondary | ICD-10-CM | POA: Diagnosis not present

## 2018-10-09 DIAGNOSIS — M7552 Bursitis of left shoulder: Secondary | ICD-10-CM | POA: Diagnosis not present

## 2018-10-09 DIAGNOSIS — M7542 Impingement syndrome of left shoulder: Secondary | ICD-10-CM | POA: Diagnosis not present

## 2018-10-09 DIAGNOSIS — S43432A Superior glenoid labrum lesion of left shoulder, initial encounter: Secondary | ICD-10-CM | POA: Diagnosis not present

## 2018-10-09 DIAGNOSIS — X58XXXA Exposure to other specified factors, initial encounter: Secondary | ICD-10-CM | POA: Diagnosis not present

## 2018-10-09 DIAGNOSIS — S46012A Strain of muscle(s) and tendon(s) of the rotator cuff of left shoulder, initial encounter: Secondary | ICD-10-CM | POA: Diagnosis not present

## 2018-10-09 DIAGNOSIS — G8918 Other acute postprocedural pain: Secondary | ICD-10-CM | POA: Diagnosis not present

## 2018-10-09 DIAGNOSIS — M7522 Bicipital tendinitis, left shoulder: Secondary | ICD-10-CM | POA: Diagnosis not present

## 2018-10-09 DIAGNOSIS — S46192A Other injury of muscle, fascia and tendon of long head of biceps, left arm, initial encounter: Secondary | ICD-10-CM | POA: Diagnosis not present

## 2018-10-12 DIAGNOSIS — M6281 Muscle weakness (generalized): Secondary | ICD-10-CM | POA: Diagnosis not present

## 2018-10-12 DIAGNOSIS — M25512 Pain in left shoulder: Secondary | ICD-10-CM | POA: Diagnosis not present

## 2018-10-12 DIAGNOSIS — S46012D Strain of muscle(s) and tendon(s) of the rotator cuff of left shoulder, subsequent encounter: Secondary | ICD-10-CM | POA: Diagnosis not present

## 2018-10-12 DIAGNOSIS — S46212D Strain of muscle, fascia and tendon of other parts of biceps, left arm, subsequent encounter: Secondary | ICD-10-CM | POA: Diagnosis not present

## 2018-10-15 DIAGNOSIS — M25512 Pain in left shoulder: Secondary | ICD-10-CM | POA: Diagnosis not present

## 2018-10-15 DIAGNOSIS — S46012D Strain of muscle(s) and tendon(s) of the rotator cuff of left shoulder, subsequent encounter: Secondary | ICD-10-CM | POA: Diagnosis not present

## 2018-10-15 DIAGNOSIS — M6281 Muscle weakness (generalized): Secondary | ICD-10-CM | POA: Diagnosis not present

## 2018-10-15 DIAGNOSIS — S46212D Strain of muscle, fascia and tendon of other parts of biceps, left arm, subsequent encounter: Secondary | ICD-10-CM | POA: Diagnosis not present

## 2018-10-19 DIAGNOSIS — S46212D Strain of muscle, fascia and tendon of other parts of biceps, left arm, subsequent encounter: Secondary | ICD-10-CM | POA: Diagnosis not present

## 2018-10-19 DIAGNOSIS — M6281 Muscle weakness (generalized): Secondary | ICD-10-CM | POA: Diagnosis not present

## 2018-10-19 DIAGNOSIS — S46012D Strain of muscle(s) and tendon(s) of the rotator cuff of left shoulder, subsequent encounter: Secondary | ICD-10-CM | POA: Diagnosis not present

## 2018-10-19 DIAGNOSIS — M25512 Pain in left shoulder: Secondary | ICD-10-CM | POA: Diagnosis not present

## 2018-10-23 DIAGNOSIS — M25512 Pain in left shoulder: Secondary | ICD-10-CM | POA: Diagnosis not present

## 2018-10-23 DIAGNOSIS — S46012D Strain of muscle(s) and tendon(s) of the rotator cuff of left shoulder, subsequent encounter: Secondary | ICD-10-CM | POA: Diagnosis not present

## 2018-10-23 DIAGNOSIS — S45212D Laceration of axillary or brachial vein, left side, subsequent encounter: Secondary | ICD-10-CM | POA: Diagnosis not present

## 2018-10-23 DIAGNOSIS — M6281 Muscle weakness (generalized): Secondary | ICD-10-CM | POA: Diagnosis not present

## 2018-10-29 DIAGNOSIS — M25512 Pain in left shoulder: Secondary | ICD-10-CM | POA: Diagnosis not present

## 2018-10-29 DIAGNOSIS — M6281 Muscle weakness (generalized): Secondary | ICD-10-CM | POA: Diagnosis not present

## 2018-10-29 DIAGNOSIS — S46012D Strain of muscle(s) and tendon(s) of the rotator cuff of left shoulder, subsequent encounter: Secondary | ICD-10-CM | POA: Diagnosis not present

## 2018-10-29 DIAGNOSIS — S46212D Strain of muscle, fascia and tendon of other parts of biceps, left arm, subsequent encounter: Secondary | ICD-10-CM | POA: Diagnosis not present

## 2018-11-02 DIAGNOSIS — M6281 Muscle weakness (generalized): Secondary | ICD-10-CM | POA: Diagnosis not present

## 2018-11-02 DIAGNOSIS — S46012D Strain of muscle(s) and tendon(s) of the rotator cuff of left shoulder, subsequent encounter: Secondary | ICD-10-CM | POA: Diagnosis not present

## 2018-11-02 DIAGNOSIS — S46212D Strain of muscle, fascia and tendon of other parts of biceps, left arm, subsequent encounter: Secondary | ICD-10-CM | POA: Diagnosis not present

## 2018-11-02 DIAGNOSIS — M25512 Pain in left shoulder: Secondary | ICD-10-CM | POA: Diagnosis not present

## 2018-11-05 DIAGNOSIS — M6281 Muscle weakness (generalized): Secondary | ICD-10-CM | POA: Diagnosis not present

## 2018-11-05 DIAGNOSIS — S46212D Strain of muscle, fascia and tendon of other parts of biceps, left arm, subsequent encounter: Secondary | ICD-10-CM | POA: Diagnosis not present

## 2018-11-05 DIAGNOSIS — S46012D Strain of muscle(s) and tendon(s) of the rotator cuff of left shoulder, subsequent encounter: Secondary | ICD-10-CM | POA: Diagnosis not present

## 2018-11-05 DIAGNOSIS — M25512 Pain in left shoulder: Secondary | ICD-10-CM | POA: Diagnosis not present

## 2018-11-11 DIAGNOSIS — M6281 Muscle weakness (generalized): Secondary | ICD-10-CM | POA: Diagnosis not present

## 2018-11-11 DIAGNOSIS — S46012D Strain of muscle(s) and tendon(s) of the rotator cuff of left shoulder, subsequent encounter: Secondary | ICD-10-CM | POA: Diagnosis not present

## 2018-11-11 DIAGNOSIS — S46212D Strain of muscle, fascia and tendon of other parts of biceps, left arm, subsequent encounter: Secondary | ICD-10-CM | POA: Diagnosis not present

## 2018-11-11 DIAGNOSIS — M25512 Pain in left shoulder: Secondary | ICD-10-CM | POA: Diagnosis not present

## 2018-11-12 DIAGNOSIS — S46212D Strain of muscle, fascia and tendon of other parts of biceps, left arm, subsequent encounter: Secondary | ICD-10-CM | POA: Diagnosis not present

## 2018-11-12 DIAGNOSIS — S46012D Strain of muscle(s) and tendon(s) of the rotator cuff of left shoulder, subsequent encounter: Secondary | ICD-10-CM | POA: Diagnosis not present

## 2018-11-12 DIAGNOSIS — M25511 Pain in right shoulder: Secondary | ICD-10-CM | POA: Diagnosis not present

## 2018-11-12 DIAGNOSIS — M24111 Other articular cartilage disorders, right shoulder: Secondary | ICD-10-CM | POA: Diagnosis not present

## 2018-11-16 DIAGNOSIS — S46012D Strain of muscle(s) and tendon(s) of the rotator cuff of left shoulder, subsequent encounter: Secondary | ICD-10-CM | POA: Diagnosis not present

## 2018-11-16 DIAGNOSIS — M6281 Muscle weakness (generalized): Secondary | ICD-10-CM | POA: Diagnosis not present

## 2018-11-16 DIAGNOSIS — S46212D Strain of muscle, fascia and tendon of other parts of biceps, left arm, subsequent encounter: Secondary | ICD-10-CM | POA: Diagnosis not present

## 2018-11-16 DIAGNOSIS — M25512 Pain in left shoulder: Secondary | ICD-10-CM | POA: Diagnosis not present

## 2018-11-19 DIAGNOSIS — S46012D Strain of muscle(s) and tendon(s) of the rotator cuff of left shoulder, subsequent encounter: Secondary | ICD-10-CM | POA: Diagnosis not present

## 2018-11-19 DIAGNOSIS — S46212D Strain of muscle, fascia and tendon of other parts of biceps, left arm, subsequent encounter: Secondary | ICD-10-CM | POA: Diagnosis not present

## 2018-11-19 DIAGNOSIS — M25512 Pain in left shoulder: Secondary | ICD-10-CM | POA: Diagnosis not present

## 2018-11-19 DIAGNOSIS — M6281 Muscle weakness (generalized): Secondary | ICD-10-CM | POA: Diagnosis not present

## 2018-11-23 DIAGNOSIS — S46012D Strain of muscle(s) and tendon(s) of the rotator cuff of left shoulder, subsequent encounter: Secondary | ICD-10-CM | POA: Diagnosis not present

## 2018-11-23 DIAGNOSIS — S46212D Strain of muscle, fascia and tendon of other parts of biceps, left arm, subsequent encounter: Secondary | ICD-10-CM | POA: Diagnosis not present

## 2018-11-23 DIAGNOSIS — M25512 Pain in left shoulder: Secondary | ICD-10-CM | POA: Diagnosis not present

## 2018-11-23 DIAGNOSIS — M6281 Muscle weakness (generalized): Secondary | ICD-10-CM | POA: Diagnosis not present

## 2018-11-27 DIAGNOSIS — M6281 Muscle weakness (generalized): Secondary | ICD-10-CM | POA: Diagnosis not present

## 2018-11-27 DIAGNOSIS — S46212D Strain of muscle, fascia and tendon of other parts of biceps, left arm, subsequent encounter: Secondary | ICD-10-CM | POA: Diagnosis not present

## 2018-11-27 DIAGNOSIS — S46012D Strain of muscle(s) and tendon(s) of the rotator cuff of left shoulder, subsequent encounter: Secondary | ICD-10-CM | POA: Diagnosis not present

## 2018-11-27 DIAGNOSIS — M25512 Pain in left shoulder: Secondary | ICD-10-CM | POA: Diagnosis not present

## 2018-11-30 DIAGNOSIS — S46012D Strain of muscle(s) and tendon(s) of the rotator cuff of left shoulder, subsequent encounter: Secondary | ICD-10-CM | POA: Diagnosis not present

## 2018-11-30 DIAGNOSIS — M6281 Muscle weakness (generalized): Secondary | ICD-10-CM | POA: Diagnosis not present

## 2018-11-30 DIAGNOSIS — S46212D Strain of muscle, fascia and tendon of other parts of biceps, left arm, subsequent encounter: Secondary | ICD-10-CM | POA: Diagnosis not present

## 2018-11-30 DIAGNOSIS — M25512 Pain in left shoulder: Secondary | ICD-10-CM | POA: Diagnosis not present

## 2018-12-04 DIAGNOSIS — M25512 Pain in left shoulder: Secondary | ICD-10-CM | POA: Diagnosis not present

## 2018-12-04 DIAGNOSIS — S46012D Strain of muscle(s) and tendon(s) of the rotator cuff of left shoulder, subsequent encounter: Secondary | ICD-10-CM | POA: Diagnosis not present

## 2018-12-04 DIAGNOSIS — S46212D Strain of muscle, fascia and tendon of other parts of biceps, left arm, subsequent encounter: Secondary | ICD-10-CM | POA: Diagnosis not present

## 2018-12-04 DIAGNOSIS — M6281 Muscle weakness (generalized): Secondary | ICD-10-CM | POA: Diagnosis not present

## 2018-12-09 DIAGNOSIS — S46012D Strain of muscle(s) and tendon(s) of the rotator cuff of left shoulder, subsequent encounter: Secondary | ICD-10-CM | POA: Diagnosis not present

## 2018-12-09 DIAGNOSIS — S46212D Strain of muscle, fascia and tendon of other parts of biceps, left arm, subsequent encounter: Secondary | ICD-10-CM | POA: Diagnosis not present

## 2018-12-09 DIAGNOSIS — M25512 Pain in left shoulder: Secondary | ICD-10-CM | POA: Diagnosis not present

## 2018-12-09 DIAGNOSIS — M6281 Muscle weakness (generalized): Secondary | ICD-10-CM | POA: Diagnosis not present

## 2018-12-14 DIAGNOSIS — M25512 Pain in left shoulder: Secondary | ICD-10-CM | POA: Diagnosis not present

## 2018-12-14 DIAGNOSIS — S46012D Strain of muscle(s) and tendon(s) of the rotator cuff of left shoulder, subsequent encounter: Secondary | ICD-10-CM | POA: Diagnosis not present

## 2018-12-14 DIAGNOSIS — M6281 Muscle weakness (generalized): Secondary | ICD-10-CM | POA: Diagnosis not present

## 2018-12-14 DIAGNOSIS — S46212D Strain of muscle, fascia and tendon of other parts of biceps, left arm, subsequent encounter: Secondary | ICD-10-CM | POA: Diagnosis not present

## 2018-12-18 DIAGNOSIS — M25512 Pain in left shoulder: Secondary | ICD-10-CM | POA: Diagnosis not present

## 2018-12-18 DIAGNOSIS — S46212D Strain of muscle, fascia and tendon of other parts of biceps, left arm, subsequent encounter: Secondary | ICD-10-CM | POA: Diagnosis not present

## 2018-12-18 DIAGNOSIS — S46012D Strain of muscle(s) and tendon(s) of the rotator cuff of left shoulder, subsequent encounter: Secondary | ICD-10-CM | POA: Diagnosis not present

## 2018-12-18 DIAGNOSIS — M6281 Muscle weakness (generalized): Secondary | ICD-10-CM | POA: Diagnosis not present

## 2018-12-21 DIAGNOSIS — S46212D Strain of muscle, fascia and tendon of other parts of biceps, left arm, subsequent encounter: Secondary | ICD-10-CM | POA: Diagnosis not present

## 2018-12-21 DIAGNOSIS — S46012D Strain of muscle(s) and tendon(s) of the rotator cuff of left shoulder, subsequent encounter: Secondary | ICD-10-CM | POA: Diagnosis not present

## 2018-12-21 DIAGNOSIS — M7711 Lateral epicondylitis, right elbow: Secondary | ICD-10-CM | POA: Diagnosis not present

## 2018-12-21 DIAGNOSIS — M25512 Pain in left shoulder: Secondary | ICD-10-CM | POA: Diagnosis not present

## 2018-12-23 DIAGNOSIS — M25512 Pain in left shoulder: Secondary | ICD-10-CM | POA: Diagnosis not present

## 2018-12-23 DIAGNOSIS — S46212D Strain of muscle, fascia and tendon of other parts of biceps, left arm, subsequent encounter: Secondary | ICD-10-CM | POA: Diagnosis not present

## 2018-12-23 DIAGNOSIS — M7711 Lateral epicondylitis, right elbow: Secondary | ICD-10-CM | POA: Diagnosis not present

## 2018-12-23 DIAGNOSIS — S43012D Anterior subluxation of left humerus, subsequent encounter: Secondary | ICD-10-CM | POA: Diagnosis not present

## 2018-12-24 DIAGNOSIS — M6281 Muscle weakness (generalized): Secondary | ICD-10-CM | POA: Diagnosis not present

## 2018-12-24 DIAGNOSIS — S46012D Strain of muscle(s) and tendon(s) of the rotator cuff of left shoulder, subsequent encounter: Secondary | ICD-10-CM | POA: Diagnosis not present

## 2018-12-24 DIAGNOSIS — S46212D Strain of muscle, fascia and tendon of other parts of biceps, left arm, subsequent encounter: Secondary | ICD-10-CM | POA: Diagnosis not present

## 2018-12-24 DIAGNOSIS — M7711 Lateral epicondylitis, right elbow: Secondary | ICD-10-CM | POA: Diagnosis not present

## 2018-12-31 DIAGNOSIS — M6281 Muscle weakness (generalized): Secondary | ICD-10-CM | POA: Diagnosis not present

## 2018-12-31 DIAGNOSIS — S46012D Strain of muscle(s) and tendon(s) of the rotator cuff of left shoulder, subsequent encounter: Secondary | ICD-10-CM | POA: Diagnosis not present

## 2018-12-31 DIAGNOSIS — M7711 Lateral epicondylitis, right elbow: Secondary | ICD-10-CM | POA: Diagnosis not present

## 2018-12-31 DIAGNOSIS — S46212D Strain of muscle, fascia and tendon of other parts of biceps, left arm, subsequent encounter: Secondary | ICD-10-CM | POA: Diagnosis not present

## 2019-01-01 DIAGNOSIS — M25512 Pain in left shoulder: Secondary | ICD-10-CM | POA: Diagnosis not present

## 2019-01-01 DIAGNOSIS — S46012D Strain of muscle(s) and tendon(s) of the rotator cuff of left shoulder, subsequent encounter: Secondary | ICD-10-CM | POA: Diagnosis not present

## 2019-01-01 DIAGNOSIS — M7711 Lateral epicondylitis, right elbow: Secondary | ICD-10-CM | POA: Diagnosis not present

## 2019-01-01 DIAGNOSIS — S46212D Strain of muscle, fascia and tendon of other parts of biceps, left arm, subsequent encounter: Secondary | ICD-10-CM | POA: Diagnosis not present

## 2019-01-04 DIAGNOSIS — S46012D Strain of muscle(s) and tendon(s) of the rotator cuff of left shoulder, subsequent encounter: Secondary | ICD-10-CM | POA: Diagnosis not present

## 2019-01-04 DIAGNOSIS — S46212D Strain of muscle, fascia and tendon of other parts of biceps, left arm, subsequent encounter: Secondary | ICD-10-CM | POA: Diagnosis not present

## 2019-01-04 DIAGNOSIS — M7711 Lateral epicondylitis, right elbow: Secondary | ICD-10-CM | POA: Diagnosis not present

## 2019-01-04 DIAGNOSIS — M25512 Pain in left shoulder: Secondary | ICD-10-CM | POA: Diagnosis not present

## 2019-01-08 DIAGNOSIS — M25512 Pain in left shoulder: Secondary | ICD-10-CM | POA: Diagnosis not present

## 2019-01-08 DIAGNOSIS — M7711 Lateral epicondylitis, right elbow: Secondary | ICD-10-CM | POA: Diagnosis not present

## 2019-01-08 DIAGNOSIS — S46012D Strain of muscle(s) and tendon(s) of the rotator cuff of left shoulder, subsequent encounter: Secondary | ICD-10-CM | POA: Diagnosis not present

## 2019-01-08 DIAGNOSIS — S46212D Strain of muscle, fascia and tendon of other parts of biceps, left arm, subsequent encounter: Secondary | ICD-10-CM | POA: Diagnosis not present

## 2019-01-11 ENCOUNTER — Other Ambulatory Visit: Payer: Self-pay | Admitting: Internal Medicine

## 2019-01-11 DIAGNOSIS — M7711 Lateral epicondylitis, right elbow: Secondary | ICD-10-CM | POA: Diagnosis not present

## 2019-01-11 DIAGNOSIS — M6281 Muscle weakness (generalized): Secondary | ICD-10-CM | POA: Diagnosis not present

## 2019-01-11 DIAGNOSIS — S46012D Strain of muscle(s) and tendon(s) of the rotator cuff of left shoulder, subsequent encounter: Secondary | ICD-10-CM | POA: Diagnosis not present

## 2019-01-11 DIAGNOSIS — S46212D Strain of muscle, fascia and tendon of other parts of biceps, left arm, subsequent encounter: Secondary | ICD-10-CM | POA: Diagnosis not present

## 2019-01-11 DIAGNOSIS — Z1231 Encounter for screening mammogram for malignant neoplasm of breast: Secondary | ICD-10-CM

## 2019-01-13 DIAGNOSIS — M7711 Lateral epicondylitis, right elbow: Secondary | ICD-10-CM | POA: Diagnosis not present

## 2019-01-13 DIAGNOSIS — S46012D Strain of muscle(s) and tendon(s) of the rotator cuff of left shoulder, subsequent encounter: Secondary | ICD-10-CM | POA: Diagnosis not present

## 2019-01-13 DIAGNOSIS — M6281 Muscle weakness (generalized): Secondary | ICD-10-CM | POA: Diagnosis not present

## 2019-01-13 DIAGNOSIS — S46212D Strain of muscle, fascia and tendon of other parts of biceps, left arm, subsequent encounter: Secondary | ICD-10-CM | POA: Diagnosis not present

## 2019-01-14 DIAGNOSIS — M7711 Lateral epicondylitis, right elbow: Secondary | ICD-10-CM | POA: Diagnosis not present

## 2019-01-14 DIAGNOSIS — M25512 Pain in left shoulder: Secondary | ICD-10-CM | POA: Diagnosis not present

## 2019-01-14 DIAGNOSIS — M6281 Muscle weakness (generalized): Secondary | ICD-10-CM | POA: Diagnosis not present

## 2019-01-14 DIAGNOSIS — S46012D Strain of muscle(s) and tendon(s) of the rotator cuff of left shoulder, subsequent encounter: Secondary | ICD-10-CM | POA: Diagnosis not present

## 2019-01-18 DIAGNOSIS — S46012D Strain of muscle(s) and tendon(s) of the rotator cuff of left shoulder, subsequent encounter: Secondary | ICD-10-CM | POA: Diagnosis not present

## 2019-01-18 DIAGNOSIS — M7711 Lateral epicondylitis, right elbow: Secondary | ICD-10-CM | POA: Diagnosis not present

## 2019-01-18 DIAGNOSIS — S46212D Strain of muscle, fascia and tendon of other parts of biceps, left arm, subsequent encounter: Secondary | ICD-10-CM | POA: Diagnosis not present

## 2019-01-19 ENCOUNTER — Encounter (HOSPITAL_BASED_OUTPATIENT_CLINIC_OR_DEPARTMENT_OTHER): Payer: Self-pay | Admitting: Physician Assistant

## 2019-01-19 DIAGNOSIS — Z9889 Other specified postprocedural states: Secondary | ICD-10-CM | POA: Diagnosis present

## 2019-01-19 DIAGNOSIS — M7711 Lateral epicondylitis, right elbow: Secondary | ICD-10-CM

## 2019-01-19 DIAGNOSIS — M25519 Pain in unspecified shoulder: Secondary | ICD-10-CM | POA: Diagnosis present

## 2019-01-19 DIAGNOSIS — M25512 Pain in left shoulder: Secondary | ICD-10-CM | POA: Diagnosis not present

## 2019-01-19 HISTORY — DX: Pain in unspecified shoulder: M25.519

## 2019-01-19 HISTORY — DX: Lateral epicondylitis, right elbow: M77.11

## 2019-01-19 HISTORY — DX: Pain in unspecified shoulder: Z98.890

## 2019-01-19 NOTE — H&P (Signed)
Rebecca Morrow is an 48 y.o. female.   Chief Complaint: left shoulder pain and right elbow pain HPI: 47 yowf s/p left shoulder rotator cuff repair, biceps tenodesis and subacromial decompression on 10/09/2018 has developed post operative adhesive capsulitis despite aggressive physical therapy and home exercise program and as well as oral medication.  Right elbow has been painful for many months over her lateral epicondyle with pain with wrist extension and supination.  She has tried oral antiinflammatories without success  Past Medical History:  Diagnosis Date  . Arthritis    lt knee  . Ganglion cyst of wrist    LT  . Kidney stones   . Lateral epicondylitis of right elbow 01/19/2019  . Left knee DJD   . Shoulder pain with history of repair of rotator cuff and biceps tenodesis on 10-09-2018 01/19/2019    Past Surgical History:  Procedure Laterality Date  . GANGLION CYST EXCISION  08/27/2012   Procedure: REMOVAL GANGLION OF WRIST;  Surgeon: Wyn Forster., MD;  Location: [facility];  Service: Orthopedics;  Laterality: Left;  EXCISION BIOPSY LEFT WRIST DORSAL GANGLION CYST  . JOINT REPLACEMENT     7  knee procedures  . KNEE ARTHROSCOPY     LT knee sx x6  . 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 TOTAL KNEE ARTHROPLASTY Left 03/29/2013   Procedure: TOTAL KNEE ARTHROPLASTY;  Surgeon: Nilda Simmer, MD;  Location: [facility] OR;  Service: Orthopedics;  Laterality: Left;    Family History  Problem Relation Age of Onset  . Heart attack Father 46  . Hypertension Father   . Diabetes Mellitus II Mother   . Arthritis Mother   . Hypertension Maternal Grandmother   . Cancer Paternal Grandmother        colon   Social History:  reports that she has never smoked. She has never used smokeless tobacco. She reports current alcohol use. She reports that she does not use drugs.  Allergies: No Known Allergies  No medications prior to admission.    No results found for  this or any previous visit (from the past 48 hour(s)). No results found.  Review of Systems  Constitutional: Negative.   HENT: Negative.   Eyes: Negative.   Cardiovascular: Negative.   Gastrointestinal: Negative.   Genitourinary: Negative.   Musculoskeletal: Positive for joint pain.  Skin: Negative.   Neurological: Negative.   Endo/Heme/Allergies: Negative.   Psychiatric/Behavioral: Negative.     Height 5\' 9"  (1.753 m), weight 77.1 kg. Physical Exam  Constitutional: She is oriented to person, place, and time. She appears well-developed and well-nourished.  HENT:  Head: Normocephalic and atraumatic.  Mouth/Throat: Oropharynx is clear and moist.  Eyes: Pupils are equal, round, and reactive to light. Conjunctivae are normal.  Neck: Neck supple.  Cardiovascular: Normal rate.  Respiratory: Effort normal.  GI: Soft.  Genitourinary:    Genitourinary Comments: Not pertinent to current symptomatology therefore not examined.   Musculoskeletal:     Comments: Left shoulder has forward flexion to 150, abduction to 140. Internal and external rotation of 50 degrees.  DNVI   Right elbow has pain over lateral epicondyle gripping pinching and weakness.  DNVI  Neurological: She is alert and oriented to person, place, and time.  Skin: Skin is dry.  Psychiatric: She has a normal mood and affect.     Assessment Principal Problem:   Left Shoulder pain with history of repair of rotator cuff and biceps tenodesis on  10-09-2018 Active Problems:   Lateral epicondylitis of right elbow   Plan Left shoulder manipulation with intra articular cortisone injection under anesthesia and right elbow lateral epicondyle cortisone injection.  The risks, benefits, and possible complications of the procedures were discussed in detail with the patient.  The patient is without question.  Kregg Cihlar J Corde Antonini, PA-C 01/19/2019, 10:51 AM

## 2019-01-20 ENCOUNTER — Encounter (HOSPITAL_BASED_OUTPATIENT_CLINIC_OR_DEPARTMENT_OTHER): Payer: Self-pay | Admitting: *Deleted

## 2019-01-20 ENCOUNTER — Other Ambulatory Visit: Payer: Self-pay

## 2019-01-25 ENCOUNTER — Ambulatory Visit (HOSPITAL_BASED_OUTPATIENT_CLINIC_OR_DEPARTMENT_OTHER)
Admission: RE | Admit: 2019-01-25 | Discharge: 2019-01-25 | Disposition: A | Discharge status: Home / Self Care | Payer: BLUE CROSS/BLUE SHIELD | Attending: Orthopedic Surgery | Admitting: Orthopedic Surgery

## 2019-01-25 ENCOUNTER — Ambulatory Visit (HOSPITAL_BASED_OUTPATIENT_CLINIC_OR_DEPARTMENT_OTHER): Payer: BLUE CROSS/BLUE SHIELD | Admitting: Anesthesiology

## 2019-01-25 ENCOUNTER — Encounter (HOSPITAL_BASED_OUTPATIENT_CLINIC_OR_DEPARTMENT_OTHER): Payer: Self-pay | Admitting: *Deleted

## 2019-01-25 ENCOUNTER — Encounter (HOSPITAL_BASED_OUTPATIENT_CLINIC_OR_DEPARTMENT_OTHER)
Admission: RE | Disposition: A | Discharge status: Home / Self Care | Payer: Self-pay | Source: Home / Self Care | Attending: Orthopedic Surgery

## 2019-01-25 ENCOUNTER — Other Ambulatory Visit: Payer: Self-pay

## 2019-01-25 DIAGNOSIS — M7502 Adhesive capsulitis of left shoulder: Secondary | ICD-10-CM | POA: Insufficient documentation

## 2019-01-25 DIAGNOSIS — M19012 Primary osteoarthritis, left shoulder: Secondary | ICD-10-CM | POA: Diagnosis not present

## 2019-01-25 DIAGNOSIS — Z9889 Other specified postprocedural states: Secondary | ICD-10-CM | POA: Insufficient documentation

## 2019-01-25 DIAGNOSIS — M7711 Lateral epicondylitis, right elbow: Secondary | ICD-10-CM | POA: Diagnosis not present

## 2019-01-25 DIAGNOSIS — Z8249 Family history of ischemic heart disease and other diseases of the circulatory system: Secondary | ICD-10-CM | POA: Diagnosis not present

## 2019-01-25 DIAGNOSIS — Z833 Family history of diabetes mellitus: Secondary | ICD-10-CM | POA: Insufficient documentation

## 2019-01-25 DIAGNOSIS — M25519 Pain in unspecified shoulder: Secondary | ICD-10-CM

## 2019-01-25 DIAGNOSIS — Z96652 Presence of left artificial knee joint: Secondary | ICD-10-CM | POA: Diagnosis not present

## 2019-01-25 DIAGNOSIS — Z87442 Personal history of urinary calculi: Secondary | ICD-10-CM | POA: Insufficient documentation

## 2019-01-25 DIAGNOSIS — M7701 Medial epicondylitis, right elbow: Secondary | ICD-10-CM | POA: Diagnosis not present

## 2019-01-25 DIAGNOSIS — G8918 Other acute postprocedural pain: Secondary | ICD-10-CM | POA: Diagnosis not present

## 2019-01-25 HISTORY — DX: Other specified postprocedural states: Z98.890

## 2019-01-25 HISTORY — PX: SHOULDER CLOSED REDUCTION: SHX1051

## 2019-01-25 HISTORY — PX: STERIOD INJECTION: SHX5046

## 2019-01-25 HISTORY — DX: Lateral epicondylitis, right elbow: M77.11

## 2019-01-25 HISTORY — DX: Pain in unspecified shoulder: M25.519

## 2019-01-25 LAB — POCT PREGNANCY, URINE: Preg Test, Ur: NEGATIVE

## 2019-01-25 SURGERY — MANIPULATION, JOINT, SHOULDER, WITH ANESTHESIA
Anesthesia: General | Site: Shoulder | Laterality: Right

## 2019-01-25 MED ORDER — DEXAMETHASONE SODIUM PHOSPHATE 4 MG/ML IJ SOLN
INTRAMUSCULAR | Status: DC | PRN
  Administered 2019-01-25: 10 mg via INTRAVENOUS

## 2019-01-25 MED ORDER — FENTANYL CITRATE (PF) 100 MCG/2ML IJ SOLN
INTRAMUSCULAR | Status: AC
  Filled 2019-01-25: qty 2

## 2019-01-25 MED ORDER — PROPOFOL 10 MG/ML IV BOLUS
INTRAVENOUS | Status: DC | PRN
  Administered 2019-01-25: 200 mg via INTRAVENOUS

## 2019-01-25 MED ORDER — FENTANYL CITRATE (PF) 100 MCG/2ML IJ SOLN
50.0000 ug | INTRAMUSCULAR | Status: DC | PRN
  Administered 2019-01-25: 50 ug via INTRAVENOUS

## 2019-01-25 MED ORDER — DEXAMETHASONE SODIUM PHOSPHATE 10 MG/ML IJ SOLN
INTRAMUSCULAR | Status: AC
  Filled 2019-01-25: qty 1

## 2019-01-25 MED ORDER — METHYLPREDNISOLONE ACETATE 80 MG/ML IJ SUSP
INTRAMUSCULAR | Status: DC | PRN
  Administered 2019-01-25: 80 mg via INTRA_ARTICULAR

## 2019-01-25 MED ORDER — MIDAZOLAM HCL 2 MG/2ML IJ SOLN
INTRAMUSCULAR | Status: AC
  Filled 2019-01-25: qty 2

## 2019-01-25 MED ORDER — LACTATED RINGERS IV SOLN
INTRAVENOUS | Status: DC
  Administered 2019-01-25: 10 mL/h via INTRAVENOUS

## 2019-01-25 MED ORDER — LACTATED RINGERS IV SOLN: INTRAVENOUS | Status: DC

## 2019-01-25 MED ORDER — HYDROCODONE-ACETAMINOPHEN 5-325 MG PO TABS: 1.0000 | ORAL_TABLET | ORAL | 0 refills | Status: DC | PRN

## 2019-01-25 MED ORDER — SCOPOLAMINE 1 MG/3DAYS TD PT72
1.0000 | MEDICATED_PATCH | Freq: Once | TRANSDERMAL | Status: DC | PRN
  Administered 2019-01-25: 1.5 mg via TRANSDERMAL

## 2019-01-25 MED ORDER — ONDANSETRON HCL 4 MG/2ML IJ SOLN
INTRAMUSCULAR | Status: AC
  Filled 2019-01-25: qty 2

## 2019-01-25 MED ORDER — BUPIVACAINE-EPINEPHRINE 0.25% -1:200000 IJ SOLN
INTRAMUSCULAR | Status: DC | PRN
  Administered 2019-01-25: 1 mL

## 2019-01-25 MED ORDER — ONDANSETRON HCL 4 MG/2ML IJ SOLN
INTRAMUSCULAR | Status: DC | PRN
  Administered 2019-01-25: 4 mg via INTRAVENOUS

## 2019-01-25 MED ORDER — CEFAZOLIN SODIUM-DEXTROSE 2-4 GM/100ML-% IV SOLN
INTRAVENOUS | Status: AC
  Filled 2019-01-25: qty 100

## 2019-01-25 MED ORDER — PROPOFOL 10 MG/ML IV BOLUS
INTRAVENOUS | Status: AC
  Filled 2019-01-25: qty 20

## 2019-01-25 MED ORDER — BUPIVACAINE-EPINEPHRINE (PF) 0.25% -1:200000 IJ SOLN
INTRAMUSCULAR | Status: DC | PRN
  Administered 2019-01-25: 10 mL

## 2019-01-25 MED ORDER — CEFAZOLIN SODIUM-DEXTROSE 2-4 GM/100ML-% IV SOLN
2.0000 g | INTRAVENOUS | Status: AC
  Administered 2019-01-25: 2 g via INTRAVENOUS

## 2019-01-25 MED ORDER — MIDAZOLAM HCL 2 MG/2ML IJ SOLN
1.0000 mg | INTRAMUSCULAR | Status: DC | PRN
  Administered 2019-01-25: 1 mg via INTRAVENOUS

## 2019-01-25 MED ORDER — METHYLPREDNISOLONE ACETATE 40 MG/ML IJ SUSP
INTRAMUSCULAR | Status: DC | PRN
  Administered 2019-01-25: 40 mg via INTRA_ARTICULAR

## 2019-01-25 MED ORDER — SCOPOLAMINE 1 MG/3DAYS TD PT72
MEDICATED_PATCH | TRANSDERMAL | Status: AC
  Filled 2019-01-25: qty 1

## 2019-01-25 SURGICAL SUPPLY — 22 items
BANDAGE ADH SHEER 1  50/CT (GAUZE/BANDAGES/DRESSINGS) ×4 IMPLANT
GLOVE BIOGEL PI IND STRL 7.0 (GLOVE) IMPLANT
GLOVE BIOGEL PI IND STRL 7.5 (GLOVE) ×2 IMPLANT
GLOVE BIOGEL PI INDICATOR 7.0 (GLOVE)
GLOVE BIOGEL PI INDICATOR 7.5 (GLOVE)
GLOVE SS BIOGEL STRL SZ 7 (GLOVE) ×2 IMPLANT
GLOVE SS BIOGEL STRL SZ 7.5 (GLOVE) IMPLANT
GLOVE SUPERSENSE BIOGEL SZ 7 (GLOVE) ×1
GLOVE SUPERSENSE BIOGEL SZ 7.5 (GLOVE)
GOWN STRL REUS W/ TWL LRG LVL3 (GOWN DISPOSABLE) ×4 IMPLANT
GOWN STRL REUS W/ TWL XL LVL3 (GOWN DISPOSABLE) ×2 IMPLANT
GOWN STRL REUS W/TWL LRG LVL3 (GOWN DISPOSABLE)
GOWN STRL REUS W/TWL XL LVL3 (GOWN DISPOSABLE)
NDL SAFETY ECLIPSE 18X1.5 (NEEDLE) ×2 IMPLANT
NDL SPNL 22GX3.5 QUINCKE BK (NEEDLE) IMPLANT
NEEDLE HYPO 18GX1.5 SHARP (NEEDLE) ×6
NEEDLE HYPO 22GX1.5 SAFETY (NEEDLE) ×4 IMPLANT
NEEDLE SPNL 22GX3.5 QUINCKE BK (NEEDLE) IMPLANT
PAD ALCOHOL SWAB (MISCELLANEOUS) ×10 IMPLANT
SLING ARM FOAM STRAP MED (SOFTGOODS) ×1 IMPLANT
SWABSTICK POVIDONE IODINE SNGL (MISCELLANEOUS) IMPLANT
SYR 20CC LL (SYRINGE) ×3 IMPLANT

## 2019-01-25 NOTE — Discharge Instructions (Signed)

## 2019-01-25 NOTE — Progress Notes (Signed)
Timeout performed at 1013 by Dr Noreene Larsson .  Fentanyl 50 mcq and Versed 2 mgm administered IV. O2 at 6 liters Simple mask, completed at 1030. Tolerated well

## 2019-01-25 NOTE — Transfer of Care (Signed)
Immediate Anesthesia Transfer of Care Note  Patient: Rebecca Morrow  Procedure(s) Performed: CLOSED MANIPULATION  LEFT SHOULDER (Left ) CORTIZONE  INJECTION IN RIGHT ELBOW (Right )  Patient Location: PACU  Anesthesia Type:GA combined with regional for post-op pain  Level of Consciousness: awake, sedated and patient cooperative  Airway & Oxygen Therapy: Patient Spontanous Breathing and Patient connected to face mask oxygen  Post-op Assessment: Report given to RN and Post -op Vital signs reviewed and stable  Post vital signs: Reviewed and stable  Last Vitals:  Vitals Value Taken Time  BP    Temp    Pulse    Resp    SpO2      Last Pain:  Vitals:   01/25/19 0945  PainSc: 3       Patients Stated Pain Goal: 7 (01/25/19 0945)  Complications: No apparent anesthesia complications

## 2019-01-25 NOTE — Interval H&P Note (Signed)
History and Physical Interval Note:  01/25/2019 9:47 AM  Rebecca Morrow  has presented today for surgery, with the diagnosis of LEFT SHOUDLER ADHESIVE CAPSULITIS, RIGHT ELBOW MEDIAL EPICONDYLITIS.  The various methods of treatment have been discussed with the patient and family. After consideration of risks, benefits and other options for treatment, the patient has consented to  Procedure(s): CLOSED MANIPULATION  LEFT SHOULDER (Left) CORTIZONE  INJECTION IN RIGHT ELBOW (Right) as a surgical intervention.  The patient's history has been reviewed, patient examined, no change in status, stable for surgery.  I have reviewed the patient's chart and labs.  Questions were answered to the patient's satisfaction.     Nilda Simmer

## 2019-01-25 NOTE — Anesthesia Procedure Notes (Signed)
Anesthesia Regional Block: Interscalene brachial plexus block   Pre-Anesthetic Checklist: ,, timeout performed, Correct Patient, Correct Site, Correct Laterality, Correct Procedure, Correct Position, site marked, Risks and benefits discussed,  Surgical consent,  Pre-op evaluation,  At surgeon's request and post-op pain management  Laterality: Left  Prep: chloraprep       Needles:  Injection technique: Single-shot  Needle Type: Stimulator Needle - 40      Needle Gauge: 22     Additional Needles:   Procedures: Doppler guided, nerve stimulator,,, ultrasound used (permanent image in chart),,,,  Narrative:  Start time: 01/25/2019 10:20 AM End time: 01/25/2019 10:30 AM Injection made incrementally with aspirations every 5 mL.  Performed by: Personally  Anesthesiologist: Kipp Brood, MD  Additional Notes: 20 cc 0.25% Bupivacaine with 1:200 epi 10 cc 1.3% Exparel injected easily

## 2019-01-25 NOTE — Op Note (Signed)
NAME: Rebecca Morrow, Rebecca Morrow MEDICAL RECORD PO:[id number] ACCOUNT [id number] DATE OF BIRTH:15-Dec-1970 FACILITY: [facility] LOCATION: [facility] PHYSICIAN:Jovie Swanner Salley Slaughter, MD  OPERATIVE REPORT  DATE OF PROCEDURE:  01/25/2019  PREOPERATIVE DIAGNOSES:   1.  Left shoulder adhesive capsulitis-3-1/2 months status post left shoulder rotator cuff repair with biceps tenodesis. 2.  Right elbow chronic traumatic lateral epicondylitis.  POSTOPERATIVE DIAGNOSES: 1.  Left shoulder adhesive capsulitis-3-1/2 months status post left shoulder rotator cuff repair with biceps tenodesis. 2.  Right elbow chronic traumatic lateral epicondylitis.  PROCEDURE: 1.  Left shoulder examination under anesthesia followed by manipulation with cortisone injection. 2.  Right elbow examination under anesthesia followed by lateral epicondyle or cortisone injection.  SURGEON:  Salvatore Marvel, MD  ANESTHESIA:  General.  OPERATIVE TIME:  Five minutes.  COMPLICATIONS:  None.  INDICATIONS:  The patient is a 48 year old who is 3-1/2 months status post left shoulder rotator cuff repair with biceps tenodesis with significant persistent adhesive capsulitis despite conservative care.  She is now to undergo manipulation and  injection.  She has also had painful recurrent right elbow lateral epicondylitis with a previous injection which has recurred, and she is now to undergo a repeat lateral epicondyle or cortisone injection.  DESCRIPTION OF PROCEDURE:  The patient was brought to the operating room on 01/25/2019 after an interscalene block was placed in the holding room by anesthesia.  She was placed under general anesthesia on her stretcher.  Initial examination of the  shoulder under anesthesia on the left side revealed forward flexion of 160, abduction 150, internal and external rotation of 50 degrees.  Time-out procedure was called, and the correct left shoulder identified and the correct right elbow identified.   Manipulation was then  carried out of the left shoulder, breaking up adhesions and improving forward flexion to 175, abduction 175, internal and external rotation of 85 degrees.  The shoulder remained stable.  The shoulder was then injected under sterile  conditions with 80 mg of Depo-Medrol and 10 mL of Marcaine with epinephrine.  At this point, attention was then turned to the right elbow.  She had full range of motion and the elbow was stable.  The lateral epicondylar region and tendons were then  injected with 40 mg of Depo-Medrol and 1 mL of Marcaine under sterile conditions, and she tolerated this well.  At this point, she was then awakened and taken to recovery room in stable condition.    FOLLOWUP CARE:  The patient will be followed as an outpatient on Norco for pain with early aggressive physical therapy.  She will be seen back in the office in a week for recheck and followup.  LN/NUANCE  D:01/25/2019 T:01/25/2019 JOB:[id number]

## 2019-01-26 ENCOUNTER — Encounter (HOSPITAL_BASED_OUTPATIENT_CLINIC_OR_DEPARTMENT_OTHER): Payer: Self-pay | Admitting: Orthopedic Surgery

## 2019-01-26 DIAGNOSIS — S46012D Strain of muscle(s) and tendon(s) of the rotator cuff of left shoulder, subsequent encounter: Secondary | ICD-10-CM | POA: Diagnosis not present

## 2019-01-26 DIAGNOSIS — M25512 Pain in left shoulder: Secondary | ICD-10-CM | POA: Diagnosis not present

## 2019-01-26 DIAGNOSIS — M6281 Muscle weakness (generalized): Secondary | ICD-10-CM | POA: Diagnosis not present

## 2019-01-26 DIAGNOSIS — M7711 Lateral epicondylitis, right elbow: Secondary | ICD-10-CM | POA: Diagnosis not present

## 2019-01-26 NOTE — Anesthesia Postprocedure Evaluation (Signed)
Anesthesia Post Note  Patient: Rebecca Morrow  Procedure(s) Performed: CLOSED MANIPULATION  LEFT SHOULDER (Left Shoulder) CORTIZONE  INJECTION IN RIGHT ELBOW (Right Elbow)     Patient location during evaluation: PACU Anesthesia Type: General Level of consciousness: awake and alert Pain management: pain level controlled Vital Signs Assessment: post-procedure vital signs reviewed and stable Respiratory status: spontaneous breathing, nonlabored ventilation, respiratory function stable and patient connected to nasal cannula oxygen Cardiovascular status: blood pressure returned to baseline and stable Postop Assessment: no apparent nausea or vomiting Anesthetic complications: no    Last Vitals:  Vitals:   01/25/19 1140 01/25/19 1206  BP:  110/79  Pulse: 77 60  Resp: 15 18  Temp:  36.6 C  SpO2: 99% 96%    Last Pain:  Vitals:   01/26/19 0921  PainSc: 0-No pain                 Zayn Selley COKER

## 2019-01-26 NOTE — Anesthesia Preprocedure Evaluation (Signed)
Anesthesia Evaluation  Patient identified by MRN, date of birth, ID band Patient awake    Reviewed: Allergy & Precautions, NPO status , Patient's Chart, lab work & pertinent test results  Airway Mallampati: II  TM Distance: >3 FB Neck ROM: Full    Dental  (+) Teeth Intact, Dental Advisory Given   Pulmonary    breath sounds clear to auscultation       Cardiovascular  Rhythm:Regular Rate:Normal     Neuro/Psych    GI/Hepatic   Endo/Other    Renal/GU      Musculoskeletal   Abdominal   Peds  Hematology   Anesthesia Other Findings   Reproductive/Obstetrics                             Anesthesia Physical Anesthesia Plan  ASA: I  Anesthesia Plan: General   Post-op Pain Management:  Regional for Post-op pain   Induction:   PONV Risk Score and Plan: Ondansetron and Dexamethasone  Airway Management Planned: Oral ETT  Additional Equipment:   Intra-op Plan:   Post-operative Plan: Extubation in OR  Informed Consent: I have reviewed the patients History and Physical, chart, labs and discussed the procedure including the risks, benefits and alternatives for the proposed anesthesia with the patient or authorized representative who has indicated his/her understanding and acceptance.     Dental advisory given  Plan Discussed with: CRNA and Anesthesiologist  Anesthesia Plan Comments:         Anesthesia Quick Evaluation

## 2019-01-27 DIAGNOSIS — S46212D Strain of muscle, fascia and tendon of other parts of biceps, left arm, subsequent encounter: Secondary | ICD-10-CM | POA: Diagnosis not present

## 2019-01-27 DIAGNOSIS — M7711 Lateral epicondylitis, right elbow: Secondary | ICD-10-CM | POA: Diagnosis not present

## 2019-01-27 DIAGNOSIS — M6281 Muscle weakness (generalized): Secondary | ICD-10-CM | POA: Diagnosis not present

## 2019-01-27 DIAGNOSIS — S46012D Strain of muscle(s) and tendon(s) of the rotator cuff of left shoulder, subsequent encounter: Secondary | ICD-10-CM | POA: Diagnosis not present

## 2019-01-28 DIAGNOSIS — M7711 Lateral epicondylitis, right elbow: Secondary | ICD-10-CM | POA: Diagnosis not present

## 2019-01-28 DIAGNOSIS — S46212D Strain of muscle, fascia and tendon of other parts of biceps, left arm, subsequent encounter: Secondary | ICD-10-CM | POA: Diagnosis not present

## 2019-01-28 DIAGNOSIS — S46012D Strain of muscle(s) and tendon(s) of the rotator cuff of left shoulder, subsequent encounter: Secondary | ICD-10-CM | POA: Diagnosis not present

## 2019-01-28 DIAGNOSIS — M6281 Muscle weakness (generalized): Secondary | ICD-10-CM | POA: Diagnosis not present

## 2019-01-29 DIAGNOSIS — M7711 Lateral epicondylitis, right elbow: Secondary | ICD-10-CM | POA: Diagnosis not present

## 2019-01-29 DIAGNOSIS — S46012D Strain of muscle(s) and tendon(s) of the rotator cuff of left shoulder, subsequent encounter: Secondary | ICD-10-CM | POA: Diagnosis not present

## 2019-01-29 DIAGNOSIS — S46212D Strain of muscle, fascia and tendon of other parts of biceps, left arm, subsequent encounter: Secondary | ICD-10-CM | POA: Diagnosis not present

## 2019-01-29 DIAGNOSIS — M6281 Muscle weakness (generalized): Secondary | ICD-10-CM | POA: Diagnosis not present

## 2019-02-01 ENCOUNTER — Encounter: Payer: BLUE CROSS/BLUE SHIELD | Admitting: Internal Medicine

## 2019-02-01 DIAGNOSIS — M6281 Muscle weakness (generalized): Secondary | ICD-10-CM | POA: Diagnosis not present

## 2019-02-01 DIAGNOSIS — M7711 Lateral epicondylitis, right elbow: Secondary | ICD-10-CM | POA: Diagnosis not present

## 2019-02-01 DIAGNOSIS — S46212D Strain of muscle, fascia and tendon of other parts of biceps, left arm, subsequent encounter: Secondary | ICD-10-CM | POA: Diagnosis not present

## 2019-02-01 DIAGNOSIS — S46012D Strain of muscle(s) and tendon(s) of the rotator cuff of left shoulder, subsequent encounter: Secondary | ICD-10-CM | POA: Diagnosis not present

## 2019-02-03 DIAGNOSIS — M6281 Muscle weakness (generalized): Secondary | ICD-10-CM | POA: Diagnosis not present

## 2019-02-03 DIAGNOSIS — S46012D Strain of muscle(s) and tendon(s) of the rotator cuff of left shoulder, subsequent encounter: Secondary | ICD-10-CM | POA: Diagnosis not present

## 2019-02-03 DIAGNOSIS — M7711 Lateral epicondylitis, right elbow: Secondary | ICD-10-CM | POA: Diagnosis not present

## 2019-02-03 DIAGNOSIS — M25512 Pain in left shoulder: Secondary | ICD-10-CM | POA: Diagnosis not present

## 2019-02-04 DIAGNOSIS — M6281 Muscle weakness (generalized): Secondary | ICD-10-CM | POA: Diagnosis not present

## 2019-02-04 DIAGNOSIS — S46012D Strain of muscle(s) and tendon(s) of the rotator cuff of left shoulder, subsequent encounter: Secondary | ICD-10-CM | POA: Diagnosis not present

## 2019-02-04 DIAGNOSIS — M7711 Lateral epicondylitis, right elbow: Secondary | ICD-10-CM | POA: Diagnosis not present

## 2019-02-04 DIAGNOSIS — S42212D Unspecified displaced fracture of surgical neck of left humerus, subsequent encounter for fracture with routine healing: Secondary | ICD-10-CM | POA: Diagnosis not present

## 2019-02-08 DIAGNOSIS — M25512 Pain in left shoulder: Secondary | ICD-10-CM | POA: Diagnosis not present

## 2019-02-08 DIAGNOSIS — S46212D Strain of muscle, fascia and tendon of other parts of biceps, left arm, subsequent encounter: Secondary | ICD-10-CM | POA: Diagnosis not present

## 2019-02-08 DIAGNOSIS — M6281 Muscle weakness (generalized): Secondary | ICD-10-CM | POA: Diagnosis not present

## 2019-02-08 DIAGNOSIS — M7711 Lateral epicondylitis, right elbow: Secondary | ICD-10-CM | POA: Diagnosis not present

## 2019-02-10 DIAGNOSIS — M7711 Lateral epicondylitis, right elbow: Secondary | ICD-10-CM | POA: Diagnosis not present

## 2019-02-10 DIAGNOSIS — S46212D Strain of muscle, fascia and tendon of other parts of biceps, left arm, subsequent encounter: Secondary | ICD-10-CM | POA: Diagnosis not present

## 2019-02-10 DIAGNOSIS — M25512 Pain in left shoulder: Secondary | ICD-10-CM | POA: Diagnosis not present

## 2019-02-10 DIAGNOSIS — S46012D Strain of muscle(s) and tendon(s) of the rotator cuff of left shoulder, subsequent encounter: Secondary | ICD-10-CM | POA: Diagnosis not present

## 2019-02-11 DIAGNOSIS — M7711 Lateral epicondylitis, right elbow: Secondary | ICD-10-CM | POA: Diagnosis not present

## 2019-02-11 DIAGNOSIS — S46012D Strain of muscle(s) and tendon(s) of the rotator cuff of left shoulder, subsequent encounter: Secondary | ICD-10-CM | POA: Diagnosis not present

## 2019-02-11 DIAGNOSIS — M25512 Pain in left shoulder: Secondary | ICD-10-CM | POA: Diagnosis not present

## 2019-02-11 DIAGNOSIS — S46212D Strain of muscle, fascia and tendon of other parts of biceps, left arm, subsequent encounter: Secondary | ICD-10-CM | POA: Diagnosis not present

## 2019-02-15 DIAGNOSIS — S46012D Strain of muscle(s) and tendon(s) of the rotator cuff of left shoulder, subsequent encounter: Secondary | ICD-10-CM | POA: Diagnosis not present

## 2019-02-15 DIAGNOSIS — S46212D Strain of muscle, fascia and tendon of other parts of biceps, left arm, subsequent encounter: Secondary | ICD-10-CM | POA: Diagnosis not present

## 2019-02-15 DIAGNOSIS — M7711 Lateral epicondylitis, right elbow: Secondary | ICD-10-CM | POA: Diagnosis not present

## 2019-02-15 DIAGNOSIS — M25512 Pain in left shoulder: Secondary | ICD-10-CM | POA: Diagnosis not present

## 2019-02-17 DIAGNOSIS — S46212D Strain of muscle, fascia and tendon of other parts of biceps, left arm, subsequent encounter: Secondary | ICD-10-CM | POA: Diagnosis not present

## 2019-02-17 DIAGNOSIS — S46012D Strain of muscle(s) and tendon(s) of the rotator cuff of left shoulder, subsequent encounter: Secondary | ICD-10-CM | POA: Diagnosis not present

## 2019-02-17 DIAGNOSIS — M25512 Pain in left shoulder: Secondary | ICD-10-CM | POA: Diagnosis not present

## 2019-02-17 DIAGNOSIS — M7711 Lateral epicondylitis, right elbow: Secondary | ICD-10-CM | POA: Diagnosis not present

## 2019-02-22 DIAGNOSIS — M25512 Pain in left shoulder: Secondary | ICD-10-CM | POA: Diagnosis not present

## 2019-02-22 DIAGNOSIS — M7711 Lateral epicondylitis, right elbow: Secondary | ICD-10-CM | POA: Diagnosis not present

## 2019-02-22 DIAGNOSIS — S46212D Strain of muscle, fascia and tendon of other parts of biceps, left arm, subsequent encounter: Secondary | ICD-10-CM | POA: Diagnosis not present

## 2019-02-22 DIAGNOSIS — S46012D Strain of muscle(s) and tendon(s) of the rotator cuff of left shoulder, subsequent encounter: Secondary | ICD-10-CM | POA: Diagnosis not present

## 2019-02-23 ENCOUNTER — Ambulatory Visit: Payer: BLUE CROSS/BLUE SHIELD

## 2019-02-24 DIAGNOSIS — M7711 Lateral epicondylitis, right elbow: Secondary | ICD-10-CM | POA: Diagnosis not present

## 2019-02-24 DIAGNOSIS — S46012D Strain of muscle(s) and tendon(s) of the rotator cuff of left shoulder, subsequent encounter: Secondary | ICD-10-CM | POA: Diagnosis not present

## 2019-02-24 DIAGNOSIS — S46212D Strain of muscle, fascia and tendon of other parts of biceps, left arm, subsequent encounter: Secondary | ICD-10-CM | POA: Diagnosis not present

## 2019-02-24 DIAGNOSIS — M25512 Pain in left shoulder: Secondary | ICD-10-CM | POA: Diagnosis not present

## 2019-03-03 DIAGNOSIS — S46012D Strain of muscle(s) and tendon(s) of the rotator cuff of left shoulder, subsequent encounter: Secondary | ICD-10-CM | POA: Diagnosis not present

## 2019-03-03 DIAGNOSIS — S46212D Strain of muscle, fascia and tendon of other parts of biceps, left arm, subsequent encounter: Secondary | ICD-10-CM | POA: Diagnosis not present

## 2019-03-03 DIAGNOSIS — M7711 Lateral epicondylitis, right elbow: Secondary | ICD-10-CM | POA: Diagnosis not present

## 2019-03-03 DIAGNOSIS — M25512 Pain in left shoulder: Secondary | ICD-10-CM | POA: Diagnosis not present

## 2019-03-17 DIAGNOSIS — S46212D Strain of muscle, fascia and tendon of other parts of biceps, left arm, subsequent encounter: Secondary | ICD-10-CM | POA: Diagnosis not present

## 2019-03-17 DIAGNOSIS — M25512 Pain in left shoulder: Secondary | ICD-10-CM | POA: Diagnosis not present

## 2019-03-17 DIAGNOSIS — S46012D Strain of muscle(s) and tendon(s) of the rotator cuff of left shoulder, subsequent encounter: Secondary | ICD-10-CM | POA: Diagnosis not present

## 2019-03-17 DIAGNOSIS — M7711 Lateral epicondylitis, right elbow: Secondary | ICD-10-CM | POA: Diagnosis not present

## 2019-04-05 ENCOUNTER — Encounter: Payer: Self-pay | Admitting: Internal Medicine

## 2019-04-05 ENCOUNTER — Other Ambulatory Visit: Payer: Self-pay

## 2019-04-05 ENCOUNTER — Ambulatory Visit (INDEPENDENT_AMBULATORY_CARE_PROVIDER_SITE_OTHER): Payer: BC Managed Care – PPO | Admitting: Internal Medicine

## 2019-04-05 ENCOUNTER — Other Ambulatory Visit (INDEPENDENT_AMBULATORY_CARE_PROVIDER_SITE_OTHER): Payer: BC Managed Care – PPO

## 2019-04-05 VITALS — BP 104/80 | HR 52 | Temp 97.7°F | Ht 69.0 in | Wt 179.0 lb

## 2019-04-05 DIAGNOSIS — Z0000 Encounter for general adult medical examination without abnormal findings: Secondary | ICD-10-CM

## 2019-04-05 DIAGNOSIS — Z23 Encounter for immunization: Secondary | ICD-10-CM

## 2019-04-05 LAB — CBC
HCT: 40.2 % (ref 36.0–46.0)
Hemoglobin: 13.4 g/dL (ref 12.0–15.0)
MCHC: 33.3 g/dL (ref 30.0–36.0)
MCV: 91.9 fl (ref 78.0–100.0)
Platelets: 251 10*3/uL (ref 150.0–400.0)
RBC: 4.38 Mil/uL (ref 3.87–5.11)
RDW: 13.1 % (ref 11.5–15.5)
WBC: 5.2 10*3/uL (ref 4.0–10.5)

## 2019-04-05 LAB — LIPID PANEL
Cholesterol: 154 mg/dL (ref 0–200)
HDL: 69.8 mg/dL (ref >39.00)
LDL Cholesterol: 76 mg/dL (ref 0–99)
NonHDL: 84.54
Total CHOL/HDL Ratio: 2
Triglycerides: 45 mg/dL (ref 0.0–149.0)
VLDL: 9 mg/dL (ref 0.0–40.0)

## 2019-04-05 LAB — COMPREHENSIVE METABOLIC PANEL
ALT: 12 U/L (ref 0–35)
AST: 18 U/L (ref 0–37)
Albumin: 4.4 g/dL (ref 3.5–5.2)
Alkaline Phosphatase: 58 U/L (ref 39–117)
BUN: 17 mg/dL (ref 6–23)
CO2: 27 mEq/L (ref 19–32)
Calcium: 9.3 mg/dL (ref 8.4–10.5)
Chloride: 105 mEq/L (ref 96–112)
Creatinine, Ser: 0.72 mg/dL (ref 0.40–1.20)
GFR: 85.73 mL/min (ref >60.00)
Glucose, Bld: 100 mg/dL — ABNORMAL HIGH (ref 70–99)
Potassium: 4.3 mEq/L (ref 3.5–5.1)
Sodium: 138 mEq/L (ref 135–145)
Total Bilirubin: 0.8 mg/dL (ref 0.2–1.2)
Total Protein: 7 g/dL (ref 6.0–8.3)

## 2019-04-05 NOTE — Patient Instructions (Addendum)
We are checking the labs today and will get you in with Dr. Paulita Fujita for colonoscopy.   Health Maintenance, Female Adopting a healthy lifestyle and getting preventive care can go a long way to promote health and wellness. Talk with your health care provider about what schedule of regular examinations is right for you. This is a good chance for you to check in with your provider about disease prevention and staying healthy. In between checkups, there are plenty of things you can do on your own. Experts have done a lot of research about which lifestyle changes and preventive measures are most likely to keep you healthy. Ask your health care provider for more information. Weight and diet Eat a healthy diet  Be sure to include plenty of vegetables, fruits, low-fat dairy products, and lean protein.  Do not eat a lot of foods high in solid fats, added sugars, or salt.  Get regular exercise. This is one of the most important things you can do for your health. ? Most adults should exercise for at least 150 minutes each week. The exercise should increase your heart rate and make you sweat (moderate-intensity exercise). ? Most adults should also do strengthening exercises at least twice a week. This is in addition to the moderate-intensity exercise. Maintain a healthy weight  Body mass index (BMI) is a measurement that can be used to identify possible weight problems. It estimates body fat based on height and weight. Your health care provider can help determine your BMI and help you achieve or maintain a healthy weight.  For females 20 years of age and older: ? A BMI below 18.5 is considered underweight. ? A BMI of 18.5 to 24.9 is normal. ? A BMI of 25 to 29.9 is considered overweight. ? A BMI of 30 and above is considered obese. Watch levels of cholesterol and blood lipids  You should start having your blood tested for lipids and cholesterol at 50 years of age, then have this test every 5  years.  You may need to have your cholesterol levels checked more often if: ? Your lipid or cholesterol levels are high. ? You are older than 50 years of age. ? You are at high risk for heart disease. Cancer screening Lung Cancer  Lung cancer screening is recommended for adults 53-70 years old who are at high risk for lung cancer because of a history of smoking.  A yearly low-dose CT scan of the lungs is recommended for people who: ? Currently smoke. ? Have quit within the past 15 years. ? Have at least a 30-pack-year history of smoking. A pack year is smoking an average of one pack of cigarettes a day for 1 year.  Yearly screening should continue until it has been 15 years since you quit.  Yearly screening should stop if you develop a health problem that would prevent you from having lung cancer treatment. Breast Cancer  Practice breast self-awareness. This means understanding how your breasts normally appear and feel.  It also means doing regular breast self-exams. Let your health care provider know about any changes, no matter how small.  If you are in your 20s or 30s, you should have a clinical breast exam (CBE) by a health care provider every 1-3 years as part of a regular health exam.  If you are 1 or older, have a CBE every year. Also consider having a breast X-ray (mammogram) every year.  If you have a family history of breast cancer, talk to  your health care provider about genetic screening.  If you are at high risk for breast cancer, talk to your health care provider about having an MRI and a mammogram every year.  Breast cancer gene (BRCA) assessment is recommended for women who have family members with BRCA-related cancers. BRCA-related cancers include: ? Breast. ? Ovarian. ? Tubal. ? Peritoneal cancers.  Results of the assessment will determine the need for genetic counseling and BRCA1 and BRCA2 testing. Cervical Cancer Your health care provider may recommend  that you be screened regularly for cancer of the pelvic organs (ovaries, uterus, and vagina). This screening involves a pelvic examination, including checking for microscopic changes to the surface of your cervix (Pap test). You may be encouraged to have this screening done every 3 years, beginning at age 44.  For women ages 11-65, health care providers may recommend pelvic exams and Pap testing every 3 years, or they may recommend the Pap and pelvic exam, combined with testing for human papilloma virus (HPV), every 5 years. Some types of HPV increase your risk of cervical cancer. Testing for HPV may also be done on women of any age with unclear Pap test results.  Other health care providers may not recommend any screening for nonpregnant women who are considered low risk for pelvic cancer and who do not have symptoms. Ask your health care provider if a screening pelvic exam is right for you.  If you have had past treatment for cervical cancer or a condition that could lead to cancer, you need Pap tests and screening for cancer for at least 20 years after your treatment. If Pap tests have been discontinued, your risk factors (such as having a new sexual partner) need to be reassessed to determine if screening should resume. Some women have medical problems that increase the chance of getting cervical cancer. In these cases, your health care provider may recommend more frequent screening and Pap tests. Colorectal Cancer  This type of cancer can be detected and often prevented.  Routine colorectal cancer screening usually begins at 50 years of age and continues through 50 years of age.  Your health care provider may recommend screening at an earlier age if you have risk factors for colon cancer.  Your health care provider may also recommend using home test kits to check for hidden blood in the stool.  A small camera at the end of a tube can be used to examine your colon directly (sigmoidoscopy or  colonoscopy). This is done to check for the earliest forms of colorectal cancer.  Routine screening usually begins at age 60.  Direct examination of the colon should be repeated every 5-10 years through 50 years of age. However, you may need to be screened more often if early forms of precancerous polyps or small growths are found. Skin Cancer  Check your skin from head to toe regularly.  Tell your health care provider about any new moles or changes in moles, especially if there is a change in a mole's shape or color.  Also tell your health care provider if you have a mole that is larger than the size of a pencil eraser.  Always use sunscreen. Apply sunscreen liberally and repeatedly throughout the day.  Protect yourself by wearing long sleeves, pants, a wide-brimmed hat, and sunglasses whenever you are outside. Heart disease, diabetes, and high blood pressure  High blood pressure causes heart disease and increases the risk of stroke. High blood pressure is more likely to develop in: ?  People who have blood pressure in the high end of the normal range (130-139/85-89 mm Hg). ? People who are overweight or obese. ? People who are African American.  If you are 29-82 years of age, have your blood pressure checked every 3-5 years. If you are 61 years of age or older, have your blood pressure checked every year. You should have your blood pressure measured twice-once when you are at a hospital or clinic, and once when you are not at a hospital or clinic. Record the average of the two measurements. To check your blood pressure when you are not at a hospital or clinic, you can use: ? An automated blood pressure machine at a pharmacy. ? A home blood pressure monitor.  If you are between 13 years and 71 years old, ask your health care provider if you should take aspirin to prevent strokes.  Have regular diabetes screenings. This involves taking a blood sample to check your fasting blood sugar  level. ? If you are at a normal weight and have a low risk for diabetes, have this test once every three years after 50 years of age. ? If you are overweight and have a high risk for diabetes, consider being tested at a younger age or more often. Preventing infection Hepatitis B  If you have a higher risk for hepatitis B, you should be screened for this virus. You are considered at high risk for hepatitis B if: ? You were born in a country where hepatitis B is common. Ask your health care provider which countries are considered high risk. ? Your parents were born in a high-risk country, and you have not been immunized against hepatitis B (hepatitis B vaccine). ? You have HIV or AIDS. ? You use needles to inject street drugs. ? You live with someone who has hepatitis B. ? You have had sex with someone who has hepatitis B. ? You get hemodialysis treatment. ? You take certain medicines for conditions, including cancer, organ transplantation, and autoimmune conditions. Hepatitis C  Blood testing is recommended for: ? Everyone born from [birth year] through 1967. ? Anyone with known risk factors for hepatitis C. Sexually transmitted infections (STIs)  You should be screened for sexually transmitted infections (STIs) including gonorrhea and chlamydia if: ? You are sexually active and are younger than 50 years of age. ? You are older than 50 years of age and your health care provider tells you that you are at risk for this type of infection. ? Your sexual activity has changed since you were last screened and you are at an increased risk for chlamydia or gonorrhea. Ask your health care provider if you are at risk.  If you do not have HIV, but are at risk, it may be recommended that you take a prescription medicine daily to prevent HIV infection. This is called pre-exposure prophylaxis (PrEP). You are considered at risk if: ? You are sexually active and do not regularly use condoms or know the HIV status  of your partner(s). ? You take drugs by injection. ? You are sexually active with a partner who has HIV. Talk with your health care provider about whether you are at high risk of being infected with HIV. If you choose to begin PrEP, you should first be tested for HIV. You should then be tested every 3 months for as long as you are taking PrEP. Pregnancy  If you are premenopausal and you may become pregnant, ask your health care provider about  preconception counseling.  If you may become pregnant, take 400 to 800 micrograms (mcg) of folic acid every day.  If you want to prevent pregnancy, talk to your health care provider about birth control (contraception). Osteoporosis and menopause  Osteoporosis is a disease in which the bones lose minerals and strength with aging. This can result in serious bone fractures. Your risk for osteoporosis can be identified using a bone density scan.  If you are 75 years of age or older, or if you are at risk for osteoporosis and fractures, ask your health care provider if you should be screened.  Ask your health care provider whether you should take a calcium or vitamin D supplement to lower your risk for osteoporosis.  Menopause may have certain physical symptoms and risks.  Hormone replacement therapy may reduce some of these symptoms and risks. Talk to your health care provider about whether hormone replacement therapy is right for you. Follow these instructions at home:  Schedule regular health, dental, and eye exams.  Stay current with your immunizations.  Do not use any tobacco products including cigarettes, chewing tobacco, or electronic cigarettes.  If you are pregnant, do not drink alcohol.  If you are breastfeeding, limit how much and how often you drink alcohol.  Limit alcohol intake to no more than 1 drink per day for nonpregnant women. One drink equals 12 ounces of beer, 5 ounces of wine, or 1 ounces of hard liquor.  Do not use street  drugs.  Do not share needles.  Ask your health care provider for help if you need support or information about quitting drugs.  Tell your health care provider if you often feel depressed.  Tell your health care provider if you have ever been abused or do not feel safe at home. This information is not intended to replace advice given to you by your health care provider. Make sure you discuss any questions you have with your health care provider. Document Released: 04/29/2011 Document Revised: 03/21/2016 Document Reviewed: 07/18/2015 Elsevier Interactive Patient Education  2019 [employer].

## 2019-04-05 NOTE — Progress Notes (Signed)
   Subjective:   Patient ID: Rebecca Morrow, female    DOB: 11/10/1970, 48 y.o.   MRN: [id number]  HPI The patient is a 48 YO female coming in for physical.   PMH, Sne[place], social history reviewed and updated  Review of Systems  Constitutional: Negative.   HENT: Negative.   Eyes: Negative.   Respiratory: Negative for cough, chest tightness and shortness of breath.   Cardiovascular: Negative for chest pain, palpitations and leg swelling.  Gastrointestinal: Negative for abdominal distention, abdominal pain, constipation, diarrhea, nausea and vomiting.  Musculoskeletal: Negative.   Skin: Negative.   Neurological: Negative.   Psychiatric/Behavioral: Negative.     Objective:  Physical Exam Constitutional:      Appearance: She is well-developed.  HENT:     Head: Normocephalic and atraumatic.  Neck:     Musculoskeletal: Normal range of motion.  Cardiovascular:     Rate and Rhythm: Normal rate and regular rhythm.  Pulmonary:     Effort: Pulmonary effort is normal. No respiratory distress.     Breath sounds: Normal breath sounds. No wheezing or rales.  Abdominal:     General: Bowel sounds are normal. There is no distension.     Palpations: Abdomen is soft.     Tenderness: There is no abdominal tenderness. There is no rebound.  Skin:    General: Skin is warm and dry.  Neurological:     Mental Status: She is alert and oriented to person, place, and time.     Coordination: Coordination normal.    Vitals:   04/05/19 0932  BP: 104/80  Pulse: (!) 52  Temp: 97.7 F (36.5 C)  TempSrc: Oral  SpO2: 98%  Weight: 179 lb (81.2 kg)  Height: 5\' 9"  (1.753 m)   EKG: Rate 51, axis normal, intervals normal, sinus brady, no st or t wave changes, no prior to compare  Assessment & Plan:  Shingrix IM given at visit

## 2019-04-05 NOTE — Addendum Note (Signed)
Addended by: Raford Pitcher R on: 04/05/2019 10:50 AM   Modules accepted: Orders

## 2019-04-05 NOTE — Assessment & Plan Note (Signed)
Flu shot counseled. Shingrix given 1st. Tetanus up to date. Colonoscopy referral done today. Mammogram up to date, pap smear up to date. Counseled about sun safety and mole surveillance. Counseled about the dangers of distracted driving. Given 10 year screening recommendations. EKG done for baseline and normal with mild bradycardia which is chronic.

## 2019-04-06 ENCOUNTER — Ambulatory Visit
Admission: RE | Admit: 2019-04-06 | Discharge: 2019-04-06 | Disposition: A | Discharge status: Home / Self Care | Payer: BC Managed Care – PPO | Source: Ambulatory Visit | Attending: Internal Medicine | Admitting: Internal Medicine

## 2019-04-06 DIAGNOSIS — Z1231 Encounter for screening mammogram for malignant neoplasm of breast: Secondary | ICD-10-CM | POA: Diagnosis not present

## 2019-06-04 DIAGNOSIS — N951 Menopausal and female climacteric states: Secondary | ICD-10-CM | POA: Diagnosis not present

## 2019-06-08 DIAGNOSIS — R232 Flushing: Secondary | ICD-10-CM | POA: Diagnosis not present

## 2019-06-08 DIAGNOSIS — M2550 Pain in unspecified joint: Secondary | ICD-10-CM | POA: Diagnosis not present

## 2019-06-08 DIAGNOSIS — G4709 Other insomnia: Secondary | ICD-10-CM | POA: Diagnosis not present

## 2019-06-08 DIAGNOSIS — N951 Menopausal and female climacteric states: Secondary | ICD-10-CM | POA: Diagnosis not present

## 2019-06-09 ENCOUNTER — Other Ambulatory Visit: Payer: Self-pay

## 2019-06-09 ENCOUNTER — Ambulatory Visit (INDEPENDENT_AMBULATORY_CARE_PROVIDER_SITE_OTHER): Payer: BC Managed Care – PPO

## 2019-06-09 DIAGNOSIS — Z23 Encounter for immunization: Secondary | ICD-10-CM

## 2019-06-09 DIAGNOSIS — Z299 Encounter for prophylactic measures, unspecified: Secondary | ICD-10-CM

## 2019-06-23 DIAGNOSIS — K219 Gastro-esophageal reflux disease without esophagitis: Secondary | ICD-10-CM | POA: Diagnosis not present

## 2019-06-23 DIAGNOSIS — R066 Hiccough: Secondary | ICD-10-CM | POA: Diagnosis not present

## 2019-06-23 DIAGNOSIS — R1310 Dysphagia, unspecified: Secondary | ICD-10-CM | POA: Diagnosis not present

## 2019-07-06 DIAGNOSIS — M2550 Pain in unspecified joint: Secondary | ICD-10-CM | POA: Diagnosis not present

## 2019-07-06 DIAGNOSIS — R232 Flushing: Secondary | ICD-10-CM | POA: Diagnosis not present

## 2019-07-06 DIAGNOSIS — G4709 Other insomnia: Secondary | ICD-10-CM | POA: Diagnosis not present

## 2019-07-06 DIAGNOSIS — N951 Menopausal and female climacteric states: Secondary | ICD-10-CM | POA: Diagnosis not present

## 2019-07-08 DIAGNOSIS — M2550 Pain in unspecified joint: Secondary | ICD-10-CM | POA: Diagnosis not present

## 2019-07-08 DIAGNOSIS — N951 Menopausal and female climacteric states: Secondary | ICD-10-CM | POA: Diagnosis not present

## 2019-07-08 DIAGNOSIS — R232 Flushing: Secondary | ICD-10-CM | POA: Diagnosis not present

## 2019-07-08 DIAGNOSIS — G4709 Other insomnia: Secondary | ICD-10-CM | POA: Diagnosis not present

## 2019-08-12 DIAGNOSIS — M65312 Trigger thumb, left thumb: Secondary | ICD-10-CM | POA: Diagnosis not present

## 2019-08-12 DIAGNOSIS — M6740 Ganglion, unspecified site: Secondary | ICD-10-CM | POA: Diagnosis not present

## 2019-08-12 DIAGNOSIS — M79645 Pain in left finger(s): Secondary | ICD-10-CM | POA: Diagnosis not present

## 2019-08-19 DIAGNOSIS — Z1159 Encounter for screening for other viral diseases: Secondary | ICD-10-CM | POA: Diagnosis not present

## 2019-08-20 DIAGNOSIS — M65312 Trigger thumb, left thumb: Secondary | ICD-10-CM | POA: Diagnosis not present

## 2019-08-24 DIAGNOSIS — Z1211 Encounter for screening for malignant neoplasm of colon: Secondary | ICD-10-CM | POA: Diagnosis not present

## 2019-08-24 DIAGNOSIS — K200 Eosinophilic esophagitis: Secondary | ICD-10-CM | POA: Diagnosis not present

## 2019-08-24 DIAGNOSIS — D125 Benign neoplasm of sigmoid colon: Secondary | ICD-10-CM | POA: Diagnosis not present

## 2019-08-24 DIAGNOSIS — D124 Benign neoplasm of descending colon: Secondary | ICD-10-CM | POA: Diagnosis not present

## 2019-08-24 DIAGNOSIS — R1310 Dysphagia, unspecified: Secondary | ICD-10-CM | POA: Diagnosis not present

## 2019-08-24 DIAGNOSIS — K228 Other specified diseases of esophagus: Secondary | ICD-10-CM | POA: Diagnosis not present

## 2019-08-24 LAB — HM COLONOSCOPY

## 2019-09-07 DIAGNOSIS — N951 Menopausal and female climacteric states: Secondary | ICD-10-CM | POA: Diagnosis not present

## 2019-09-09 DIAGNOSIS — N951 Menopausal and female climacteric states: Secondary | ICD-10-CM | POA: Diagnosis not present

## 2019-09-09 DIAGNOSIS — M2550 Pain in unspecified joint: Secondary | ICD-10-CM | POA: Diagnosis not present

## 2019-09-09 DIAGNOSIS — G4709 Other insomnia: Secondary | ICD-10-CM | POA: Diagnosis not present

## 2019-09-09 DIAGNOSIS — R232 Flushing: Secondary | ICD-10-CM | POA: Diagnosis not present

## 2019-09-15 ENCOUNTER — Encounter: Payer: Self-pay | Admitting: Internal Medicine

## 2019-09-15 NOTE — Progress Notes (Signed)
Abstracted and sent to scan  

## 2019-11-20 ENCOUNTER — Encounter: Payer: Self-pay | Admitting: Internal Medicine

## 2019-11-21 ENCOUNTER — Telehealth: Payer: BC Managed Care – PPO

## 2019-11-21 ENCOUNTER — Other Ambulatory Visit: Payer: Self-pay

## 2019-11-21 ENCOUNTER — Encounter: Payer: Self-pay | Admitting: Emergency Medicine

## 2019-11-21 ENCOUNTER — Emergency Department (INDEPENDENT_AMBULATORY_CARE_PROVIDER_SITE_OTHER)
Admission: EM | Admit: 2019-11-21 | Discharge: 2019-11-21 | Disposition: A | Discharge status: Home / Self Care | Payer: BC Managed Care – PPO | Source: Home / Self Care

## 2019-11-21 DIAGNOSIS — N390 Urinary tract infection, site not specified: Secondary | ICD-10-CM | POA: Diagnosis not present

## 2019-11-21 DIAGNOSIS — R300 Dysuria: Secondary | ICD-10-CM

## 2019-11-21 LAB — POCT URINALYSIS DIP (MANUAL ENTRY)
Blood, UA: NEGATIVE
Glucose, UA: 100 mg/dL — AB
Leukocytes, UA: NEGATIVE
Nitrite, UA: POSITIVE — AB
Protein Ur, POC: 30 mg/dL — AB
Spec Grav, UA: 1.02 (ref 1.010–1.025)
Urobilinogen, UA: 1 E.U./dL
pH, UA: 5.5 (ref 5.0–8.0)

## 2019-11-21 MED ORDER — NITROFURANTOIN MONOHYD MACRO 100 MG PO CAPS: 100.0000 mg | ORAL_CAPSULE | Freq: Two times a day (BID) | ORAL | 0 refills | Status: AC

## 2019-11-21 NOTE — ED Triage Notes (Signed)
Patient c/o dysuria x 1 day, some left side pain, unsure if kidney stone or not, taking OTC AZO meds. No hematuria, afebrile.

## 2019-11-21 NOTE — Discharge Instructions (Addendum)
Return if symptoms worsen or do improve. Hydrate well with water or cranberry fruit juice this will help clear infection along with completing the antibiotic therapy. Your urine culture is pending.  If any antibiotic therapy needs to be changed we will contact you via MyChart.

## 2019-11-21 NOTE — ED Provider Notes (Signed)
Re[facility]    CSN: [id number] Arrival date & time: 11/21/19  0956      History   Chief Complaint Chief Complaint  Patient presents with  . Urinary Tract Infection    HPI Rebecca Morrow is a 49 y.o. female.   HPI Rebecca Morrow is a 49 y.o. female presents for evaluation of dysuria x 1 days, with left sided flank pain which has improved today. No fever, chills, or abnormal vaginal discharge or bleeding. No concern for STD. Taken AZO for dysuria symptoms with improvement of pain. No prior history of UTI. No LMP recorded. (Menstrual status: Perimenopausal). Past Medical History:  Diagnosis Date  . Arthritis    lt knee  . Ganglion cyst of wrist    LT  . Kidney stones   . Lateral epicondylitis of right elbow 01/19/2019  . Left knee DJD   . Shoulder pain with history of repair of rotator cuff and biceps tenodesis on 10-09-2018 01/19/2019    Patient Active Problem List   Diagnosis Date Noted  . Left Shoulder pain with history of repair of rotator cuff and biceps tenodesis on 10-09-2018 01/19/2019  . Routine general medical examination at a health care facility 10/12/2015  . 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    Past Surgical History:  Procedure Laterality Date  . GANGLION CYST EXCISION  08/27/2012   Procedure: REMOVAL GANGLION OF WRIST;  Surgeon: Wyn Forster., MD;  Location: [facility];  Service: Orthopedics;  Laterality: Left;  EXCISION BIOPSY LEFT WRIST DORSAL GANGLION CYST  . JOINT REPLACEMENT     7  knee procedures  . KNEE ARTHROSCOPY     LT knee sx x6  . SHOULDER CLOSED REDUCTION Left 01/25/2019   Procedure: CLOSED MANIPULATION  LEFT SHOULDER;  Surgeon: Salvatore Marvel, MD;  Location: [facility];  Service: Orthopedics;  Laterality: Left;  left  . STERIOD INJECTION Right 01/25/2019   Procedure: CORTIZONE  INJECTION IN RIGHT ELBOW;  Surgeon: Salvatore Marvel, MD;  Location: [facility];  Service: Orthopedics;  Laterality:  Right;  right  . 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 TOTAL KNEE ARTHROPLASTY Left 03/29/2013   Procedure: TOTAL KNEE ARTHROPLASTY;  Surgeon: Nilda Simmer, MD;  Location: [facility];  Service: Orthopedics;  Laterality: Left;    OB History   No obstetric history on file.      Home Medications    Prior to Admission medications   Medication Sig Start Date End Date Taking? Authorizing Provider  celecoxib (CELEBREX) 200 MG capsule  02/01/19   [provider]    Family History Family History  Problem Relation Age of Onset  . Heart attack Father 29  . Hypertension Father   . Diabetes Mellitus II Mother   . Arthritis Mother   . Hypertension Maternal Grandmother   . Cancer Paternal Grandmother        colon    Social History Social History   Tobacco Use  . Smoking status: Never Smoker  . Smokeless tobacco: Never Used  Substance Use Topics  . Alcohol use: Yes    Comment: social  . Drug use: No     Allergies   Patient has no known allergies.   Review of Systems Review of Systems Pertinent negatives listed in HPI  Physical Exam Triage Vital Signs ED Triage Vitals  Enc Vitals Group     BP 11/21/19 1010 127/82     Pulse Rate  11/21/19 1010 74     Resp --      Temp 11/21/19 1010 98.2 F (36.8 C)     Temp Source 11/21/19 1010 Oral     SpO2 11/21/19 1010 97 %     Weight 11/21/19 1011 182 lb 8 oz (82.8 kg)     Height 11/21/19 1011 5\' 9"  (1.753 m)     Head Circumference --      Peak Flow --      Pain Score 11/21/19 1011 4     Pain Loc --      Pain Edu? --      Excl. in [place]? --    No data found.  Updated Vital Signs BP 127/82 (BP Location: Right Arm)   Pulse 74   Temp 98.2 F (36.8 C) (Oral)   Ht 5\' 9"  (1.753 m)   Wt 182 lb 8 oz (82.8 kg)   SpO2 97%   BMI 26.95 kg/m   Visual Acuity Right Eye Distance:   Left Eye Distance:   Bilateral Distance:    Right Eye Near:   Left Eye Near:    Bilateral Near:     Physical Exam General  appearance: alert, well developed, well nourished, cooperative and in no distress Head: Normocephalic, without obvious abnormality, atraumatic Respiratory: Respirations even and unlabored, normal respiratory rate Heart: rate and rhythm normal. No gallop or murmurs noted on exam  Abdomen: BS +, no distention, no CVA tenderness, or no mass Extremities: No gross deformities Skin: Skin color, texture, turgor normal. No rashes seen  Psych: Appropriate mood and affect. Neurologic:Alert, oriented to person, place, and time, thought content appropriate.  UC Treatments / Results  Labs (all labs ordered are listed, but only abnormal results are displayed) Labs Reviewed  URINE CULTURE    EKG   Radiology No results found.  Procedures Procedures (including critical care time)  Medications Ordered in UC Medications - No data to display  Initial Impression / Assessment and Plan / UC Course  I have reviewed the triage vital signs and the nursing notes.  Pertinent labs & imaging results that were available during my care of the patient were reviewed by me and considered in my medical decision making (see chart for detail    Acute UTI, uncomplicated. Trial Macrobid. Urine culture pending. Red flags discussed. Patient verbalized understanding and agreement with plan. Final Clinical Impressions(s) / UC Diagnoses   Final diagnoses:  Dysuria  Acute UTI (urinary tract infection)     Discharge Instructions     Return if symptoms worsen or do improve. Hydrate well with water or cranberry fruit juice this will help clear infection along with completing the antibiotic therapy. Your urine culture is pending.  If any antibiotic therapy needs to be changed we will contact you via MyChart.    ED Prescriptions    Medication Sig Dispense Auth. Provider   nitrofurantoin, macrocrystal-monohydrate, (MACROBID) 100 MG capsule Take 1 capsule (100 mg total) by mouth 2 (two) times daily for 7 days. 14  capsule Scot Jun, FNP     PDMP not reviewed this encounter.   Scot Jun, FNP 11/22/19 2259

## 2019-11-23 DIAGNOSIS — Z86010 Personal history of colonic polyps: Secondary | ICD-10-CM | POA: Diagnosis not present

## 2019-11-23 DIAGNOSIS — K200 Eosinophilic esophagitis: Secondary | ICD-10-CM | POA: Diagnosis not present

## 2019-11-23 LAB — URINE CULTURE
MICRO NUMBER:: 10076231
Result:: NO GROWTH
SPECIMEN QUALITY:: ADEQUATE

## 2019-12-02 ENCOUNTER — Ambulatory Visit (INDEPENDENT_AMBULATORY_CARE_PROVIDER_SITE_OTHER): Payer: BC Managed Care – PPO | Admitting: Allergy and Immunology

## 2019-12-02 ENCOUNTER — Encounter: Payer: Self-pay | Admitting: Allergy and Immunology

## 2019-12-02 ENCOUNTER — Other Ambulatory Visit: Payer: Self-pay

## 2019-12-02 VITALS — BP 102/60 | HR 78 | Temp 97.9°F | Resp 20 | Ht 68.0 in | Wt 180.8 lb

## 2019-12-02 DIAGNOSIS — K9049 Malabsorption due to intolerance, not elsewhere classified: Secondary | ICD-10-CM

## 2019-12-02 DIAGNOSIS — K200 Eosinophilic esophagitis: Secondary | ICD-10-CM

## 2019-12-02 NOTE — Assessment & Plan Note (Signed)
Allergen skin tests were negative today despite a positive histamine control.    The six-food elimination diet (SFED) is a frequently employed dietary therapy in patients with EoE. This diet typically trials the exclusion of wheat, cow's milk, egg, nuts, soy, fish and shellfish. Symptoms are monitored and often times an upper endoscopy and biopsy is performed after six weeks of the SFED diet for objective assessment.  At the minimum, cow's milk should be eliminated from the diet for at least 6 weeks with subjective monitoring plus/minus objective monitoring.  For now, continue budesonide slurry twice daily and follow-up with Dr. Dulce Sellar as recommended.

## 2019-12-02 NOTE — Progress Notes (Signed)
New Patient Note  RE: Rebecca Morrow MRN: [id number] DOB: [birth year] Date of Office Visit: 12/02/2019  Referring provider: Willis Modena, MD Primary care provider: Myrlene Broker, MD  Chief Complaint: Gastroesophageal Reflux   History of present illness: Rebecca Morrow is a 49 y.o. female seen today in consultation requested by Willis Modena, MD.  She reports that she has experienced occasional dysphagia over the past several years.  She reports that bread, and less often meat, "gets stuck and has a hard time going down."  The dysphagia typically resolves with a drink of water and she has never required disimpaction.  She also reports that she frequently hiccups with meals and occasionally experiences heartburn.  She has tried over-the-counter Nexium "off-and-on" for the past 4 to 5 years without perceived benefit.  She recently underwent EGD per Dr. Dulce Sellar and was diagnosed with eosinophilic esophagitis.  She was started on Flovent, and 2 puffs swallowed twice daily without improvement in the dysphagia, therefore approximately 1 week ago she was switched to budesonide/Splenda slurry twice daily.  She may rarely experience mild rhinitis symptoms and has no history of symptoms consistent with asthma or eczema.  Assessment and plan: Eosinophilic esophagitis Allergen skin tests were negative today despite a positive histamine control.    The six-food elimination diet (SFED) is a frequently employed dietary therapy in patients with EoE. This diet typically trials the exclusion of wheat, cow's milk, egg, nuts, soy, fish and shellfish. Symptoms are monitored and often times an upper endoscopy and biopsy is performed after six weeks of the SFED diet for objective assessment.  At the minimum, cow's milk should be eliminated from the diet for at least 6 weeks with subjective monitoring plus/minus objective monitoring.  For now, continue budesonide slurry twice daily and follow-up with Dr. Dulce Sellar  as recommended.   Diagnostics: Environmental skin testing: Negative despite a positive histamine control. Food allergen skin testing: Negative despite a positive histamine control.    Physical examination: Blood pressure 102/60, pulse 78, temperature 97.9 F (36.6 C), temperature source Oral, resp. rate 20, height 5\' 8"  (1.727 m), weight 180 lb 12.8 oz (82 kg), SpO2 98 %.  General: Alert, interactive, in no acute distress. HEENT: TMs pearly gray, turbinates minimally edematous without discharge, post-pharynx unremarkable. Neck: Supple without lymphadenopathy. Lungs: Clear to auscultation without wheezing, rhonchi or rales. CV: Normal S1, S2 without murmurs. Abdomen: Nondistended, nontender. Skin: Warm and dry, without lesions or rashes. Extremities:  No clubbing, cyanosis or edema. Neuro:   Grossly intact.  Review of systems:  Review of systems negative except as noted in HPI / PMHx or noted below: Review of Systems  Constitutional: Negative.   HENT: Negative.   Eyes: Negative.   Respiratory: Negative.   Cardiovascular: Negative.   Gastrointestinal: Negative.   Genitourinary: Negative.   Musculoskeletal: Negative.   Skin: Negative.   Neurological: Negative.   Endo/Heme/Allergies: Negative.   Psychiatric/Behavioral: Negative.     Past medical history:  Past Medical History:  Diagnosis Date  . Arthritis    lt knee  . Ganglion cyst of wrist    LT  . Kidney stones   . Lateral epicondylitis of right elbow 01/19/2019  . Left knee DJD   . Shoulder pain with history of repair of rotator cuff and biceps tenodesis on 10-09-2018 01/19/2019    Past surgical history:  Past Surgical History:  Procedure Laterality Date  . GANGLION CYST EXCISION  08/27/2012   Procedure: REMOVAL GANGLION OF WRIST;  Surgeon:  Wyn Forster., MD;  Location: [facility];  Service: Orthopedics;  Laterality: Left;  EXCISION BIOPSY LEFT WRIST DORSAL GANGLION CYST  . JOINT REPLACEMENT      7  knee procedures  . KNEE ARTHROSCOPY     LT knee sx x6  . SHOULDER CLOSED REDUCTION Left 01/25/2019   Procedure: CLOSED MANIPULATION  LEFT SHOULDER;  Surgeon: Salvatore Marvel, MD;  Location: [facility];  Service: Orthopedics;  Laterality: Left;  left  . STERIOD INJECTION Right 01/25/2019   Procedure: CORTIZONE  INJECTION IN RIGHT ELBOW;  Surgeon: Salvatore Marvel, MD;  Location: [facility];  Service: Orthopedics;  Laterality: Right;  right  . 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 TOTAL KNEE ARTHROPLASTY Left 03/29/2013   Procedure: TOTAL KNEE ARTHROPLASTY;  Surgeon: Nilda Simmer, MD;  Location: [facility];  Service: Orthopedics;  Laterality: Left;    Family history: Family History  Problem Relation Age of Onset  . Heart attack Father 45  . Hypertension Father   . Diabetes Mellitus II Mother   . Arthritis Mother   . Hypertension Maternal Grandmother   . Cancer Paternal Grandmother        colon  . Allergic rhinitis Neg Hx   . Angioedema Neg Hx   . Asthma Neg Hx   . Eczema Neg Hx   . Immunodeficiency Neg Hx   . Urticaria Neg Hx     Social history: Social History   Socioeconomic History  . Marital status: Single    Spouse name: Not on file  . Number of children: Not on file  . Years of education: Not on file  . Highest education level: Not on file  Occupational History  . Not on file  Tobacco Use  . Smoking status: Never Smoker  . Smokeless tobacco: Never Used  Substance and Sexual Activity  . Alcohol use: Yes    Comment: social  . Drug use: No  . Sexual activity: Not on file    Comment: female partner  Other Topics Concern  . Not on file  Social History Narrative  . Not on file   Social Determinants of Health   Financial Resource Strain:   . Difficulty of Paying Living Expenses: Not on file  Food Insecurity:   . Worried About Programme researcher, broadcasting/film/video in the Last Year: Not on file  . Ran Out of Food in the Last Year:  Not on file  Transportation Needs:   . Lack of Transportation (Medical): Not on file  . Lack of Transportation (Non-Medical): Not on file  Physical Activity:   . Days of Exercise per Week: Not on file  . Minutes of Exercise per Session: Not on file  Stress:   . Feeling of Stress : Not on file  Social Connections:   . Frequency of Communication with Friends and Family: Not on file  . Frequency of Social Gatherings with Friends and Family: Not on file  . Attends Religious Services: Not on file  . Active Member of Clubs or Organizations: Not on file  . Attends Banker Meetings: Not on file  . Marital Status: Not on file  Intimate Partner Violence:   . Fear of Current or Ex-Partner: Not on file  . Emotionally Abused: Not on file  . Physically Abused: Not on file  . Sexually Abused: Not on file    Environmental History: The patient lives in an 49-year-old house with concrete  floors, gassy, and central air.  There is no known mold/water damage in the home.  There is a dog and a cat in the home which have access to her bedroom.  She is a non-smoker.  Current Outpatient Medications  Medication Sig Dispense Refill  . BUDESONIDE PO Take by mouth 2 (two) times daily. Budesonide slurry     No current facility-administered medications for this visit.    Known medication allergies: No Known Allergies  I appreciate the opportunity to take part in Huma's care. Please do not hesitate to contact me with questions.  Sincerely,   R. Edgar Frisk, MD

## 2019-12-02 NOTE — Patient Instructions (Addendum)
Eosinophilic esophagitis Allergen skin tests were negative today despite a positive histamine control.    The six-food elimination diet (SFED) is a frequently employed dietary therapy in patients with EoE. This diet typically trials the exclusion of wheat, cow's milk, egg, nuts, soy, fish and shellfish. Symptoms are monitored and often times an upper endoscopy and biopsy is performed after six weeks of the SFED diet for objective assessment.  At the minimum, cow's milk should be eliminated from the diet for at least 6 weeks with subjective monitoring plus/minus objective monitoring.  For now, continue budesonide slurry twice daily and follow-up with Dr. Dulce Sellar as recommended.   Follow up if needed.

## 2019-12-09 DIAGNOSIS — M2550 Pain in unspecified joint: Secondary | ICD-10-CM | POA: Diagnosis not present

## 2019-12-09 DIAGNOSIS — G4709 Other insomnia: Secondary | ICD-10-CM | POA: Diagnosis not present

## 2019-12-09 DIAGNOSIS — R232 Flushing: Secondary | ICD-10-CM | POA: Diagnosis not present

## 2019-12-09 DIAGNOSIS — N951 Menopausal and female climacteric states: Secondary | ICD-10-CM | POA: Diagnosis not present

## 2019-12-10 ENCOUNTER — Telehealth: Payer: Self-pay | Admitting: Allergy and Immunology

## 2019-12-10 NOTE — Telephone Encounter (Signed)
[facility] rep, Ladonna Snide called to get consult notes from 2/4 w/Bobbitt faxed to [phone]

## 2019-12-10 NOTE — Telephone Encounter (Signed)
Notes fax to dr outlaw as he is the referring dr

## 2019-12-21 DIAGNOSIS — R232 Flushing: Secondary | ICD-10-CM | POA: Diagnosis not present

## 2019-12-21 DIAGNOSIS — N951 Menopausal and female climacteric states: Secondary | ICD-10-CM | POA: Diagnosis not present

## 2019-12-21 DIAGNOSIS — G4709 Other insomnia: Secondary | ICD-10-CM | POA: Diagnosis not present

## 2020-01-25 DIAGNOSIS — Z20828 Contact with and (suspected) exposure to other viral communicable diseases: Secondary | ICD-10-CM | POA: Diagnosis not present

## 2020-01-25 DIAGNOSIS — Z03818 Encounter for observation for suspected exposure to other biological agents ruled out: Secondary | ICD-10-CM | POA: Diagnosis not present

## 2020-02-15 ENCOUNTER — Other Ambulatory Visit: Payer: Self-pay

## 2020-02-15 ENCOUNTER — Ambulatory Visit: Payer: BC Managed Care – PPO | Admitting: Sports Medicine

## 2020-02-15 ENCOUNTER — Other Ambulatory Visit: Payer: Self-pay | Admitting: Sports Medicine

## 2020-02-15 ENCOUNTER — Ambulatory Visit (INDEPENDENT_AMBULATORY_CARE_PROVIDER_SITE_OTHER): Payer: BC Managed Care – PPO

## 2020-02-15 ENCOUNTER — Encounter: Payer: Self-pay | Admitting: Sports Medicine

## 2020-02-15 VITALS — Temp 98.0°F

## 2020-02-15 DIAGNOSIS — M25871 Other specified joint disorders, right ankle and foot: Secondary | ICD-10-CM

## 2020-02-15 DIAGNOSIS — M2041 Other hammer toe(s) (acquired), right foot: Secondary | ICD-10-CM

## 2020-02-15 DIAGNOSIS — M7751 Other enthesopathy of right foot: Secondary | ICD-10-CM

## 2020-02-15 DIAGNOSIS — M779 Enthesopathy, unspecified: Secondary | ICD-10-CM

## 2020-02-15 DIAGNOSIS — M79671 Pain in right foot: Secondary | ICD-10-CM

## 2020-02-15 MED ORDER — TRIAMCINOLONE ACETONIDE 10 MG/ML IJ SUSP
10.0000 mg | Freq: Once | INTRAMUSCULAR | Status: AC
  Administered 2020-02-15: 10 mg

## 2020-02-15 NOTE — Progress Notes (Signed)
Subjective: Rebecca Morrow is a 49 y.o. female patient who presents to office for evaluation of right foot pain. Patient complains of progressive pain especially over the last 6 months in the right foot at the ball. Ranks pain 5-6/10 and is now interferring with daily activities reports that it started after playing a game of basketball with her niece. Patient has tried over-the-counter anti-inflammatories with only a little relief in symptoms.  Reports that pain is worse with certain shoes and walking barefoot and with certain activities like increase in walking or when she uses her Peloton bike.  Patient reports that it feels sharp like she is walking on a pillow with some swelling with activity.  Patient denies any other pedal complaints.  Patient also admits to a history of previous right foot bunion surgery and reports that this area is doing good.  Denies any other pedal complaints or issues at this time.  Patient Active Problem List   Diagnosis Date Noted  . Eosinophilic esophagitis 02/54/2706  . Intolerance, food 12/02/2019  . Left Shoulder pain with history of repair of rotator cuff and biceps tenodesis on 10-09-2018 01/19/2019  . Routine general medical examination at a health care facility 10/12/2015  . 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    No current outpatient medications on file prior to visit.   No current facility-administered medications on file prior to visit.    No Known Allergies  Objective:  General: Alert and oriented x3 in no acute distress  Dermatology: No open lesions bilateral lower extremities, no webspace macerations, old surgical scar right first ray well-healed.  No ecchymosis bilateral, all nails x 10 are well manicured.  Early plantar callus right foot.  Vascular: Dorsalis Pedis and Posterior Tibial pedal pulses palpable, Capillary Fill Time 3 seconds,(+) pedal hair growth bilateral, no edema bilateral lower extremities, Temperature gradient within normal  limits.  Neurology: Johney Maine sensation intact via light touch bilateral.  Musculoskeletal: Mild to moderate tenderness with palpation at plantar second metatarsophalangeal joint on the right foot.  There is also mild digital contracture of the second toe joint pain is worse with attempting to dislocate the joint and extreme range of motion especially on dorsiflexion at the second metatarsophalangeal joint.  There is no frank instability around the right second toe joint however there is mild swelling and pain with early plantar callus noted to the right forefoot supportive of the mechanical overload to this area.  Gait: Mildly antalgic gait  Xrays  Right foot   Impression: Hardware intact at first metatarsal.  There is a long second metatarsal noted with early elevation and digital contractures supportive of hammertoe deformity.  No other acute findings noted.  Assessment and Plan: Problem List Items Addressed This Visit    None    Visit Diagnoses    Right foot pain    -  Primary   Relevant Orders   DG Foot Complete Right   Capsulitis       Relevant Medications   triamcinolone acetonide (KENALOG) 10 MG/ML injection 10 mg (Completed) (Start on 02/15/2020 12:30 PM)   Hammertoe of right foot       Predislocation syndrome of metatarsophalangeal joint of right foot           -Complete examination performed -Xrays reviewed -Discussed treatement options for likely a long second metatarsal with surrounding capsulitis hammertoe and possible early predislocation syndrome -After oral consent and aseptic prep, injected a mixture containing 1 ml of 2%  plain lidocaine, 1 ml 0.5% plain  marcaine, 0.5 ml of kenalog 10 and 0.5 ml of dexamethasone phosphate into right second metatarsophalangeal joint without complication. Post-injection care discussed with patient.  -Advised patient to continue good supportive shoes -Educated patient on how to properly strap or tape the toe when she is attempting to do  rigorous activity like exercise or walking advised patient if symptoms are improved and taping helps would benefit from custom orthotics -Patient to return to office as scheduled in 1 month or sooner if condition worsens.  Asencion Islam, DPM

## 2020-03-14 ENCOUNTER — Encounter: Payer: Self-pay | Admitting: Sports Medicine

## 2020-03-14 ENCOUNTER — Other Ambulatory Visit: Payer: Self-pay

## 2020-03-14 ENCOUNTER — Ambulatory Visit (INDEPENDENT_AMBULATORY_CARE_PROVIDER_SITE_OTHER): Payer: BC Managed Care – PPO | Admitting: Sports Medicine

## 2020-03-14 VITALS — Temp 97.7°F

## 2020-03-14 DIAGNOSIS — M25871 Other specified joint disorders, right ankle and foot: Secondary | ICD-10-CM | POA: Diagnosis not present

## 2020-03-14 DIAGNOSIS — M779 Enthesopathy, unspecified: Secondary | ICD-10-CM

## 2020-03-14 DIAGNOSIS — M79671 Pain in right foot: Secondary | ICD-10-CM

## 2020-03-14 DIAGNOSIS — M7741 Metatarsalgia, right foot: Secondary | ICD-10-CM

## 2020-03-14 DIAGNOSIS — M2041 Other hammer toe(s) (acquired), right foot: Secondary | ICD-10-CM | POA: Diagnosis not present

## 2020-03-14 NOTE — Progress Notes (Signed)
Subjective: Rebecca Morrow is a 49 y.o. female patient who returns office for follow-up evaluation of right foot pain.  Patient reports that after the injection it helped still has some pain depending on the amount of pressure that she is putting to the area underneath the ball of the foot however feels much better than last visit.  Patient reports that she tried to do the taping and strapping when she was exercising but Sunday she did forget.  Denies any other pedal complaints or issues at this time.  Patient Active Problem List   Diagnosis Date Noted  . Eosinophilic esophagitis 12/02/2019  . Intolerance, food 12/02/2019  . Left Shoulder pain with history of repair of rotator cuff and biceps tenodesis on 10-09-2018 01/19/2019  . Routine general medical examination at a health care facility 10/12/2015  . 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    No current outpatient medications on file prior to visit.   No current facility-administered medications on file prior to visit.    No Known Allergies  Objective:  General: Alert and oriented x3 in no acute distress  Dermatology: No open lesions bilateral lower extremities, no webspace macerations, old surgical scar right first ray well-healed.  No ecchymosis bilateral, all nails x 10 are well manicured.  Early plantar callus right foot.  Vascular: Dorsalis Pedis and Posterior Tibial pedal pulses palpable, Capillary Fill Time 3 seconds,(+) pedal hair growth bilateral, no edema bilateral lower extremities, Temperature gradient within normal limits.  Neurology: Michaell Cowing sensation intact via light touch bilateral.  Musculoskeletal: Decreased tenderness with palpation at plantar second metatarsophalangeal joint on the right foot.  There is also mild digital contracture of the second toe joint pain with decreased pain with manipulation of the second toe joint at today's visit, there is no frank instability around the right second toe joint however there is mild swelling  and pain with early plantar callus noted to the right forefoot supportive of the mechanical overload to this area that is slightly improved from prior.  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       Right foot pain       Metatarsalgia, right foot           -Complete examination performed -Re-Discussed treatement options for likely a long second metatarsal with surrounding capsulitis hammertoe and possible early predislocation syndrome that is slowly improving -Dispensed metatarsal pad sleeve for patient to use as instructed -Advised patient to continue good supportive shoes daily -Office to check orthotic benefits and will call her and if patient wants to proceed with getting orthotics I highly recommend this to decrease any pressure at the second metatarsophalangeal joint -Advised patient again to avoid any strenuous activity that could overstress her toe joint until she can be assessed for custom orthotics -Patient to return to office as scheduled or sooner if condition worsens.  Asencion Islam, DPM

## 2020-03-29 DIAGNOSIS — R232 Flushing: Secondary | ICD-10-CM | POA: Diagnosis not present

## 2020-03-29 DIAGNOSIS — N951 Menopausal and female climacteric states: Secondary | ICD-10-CM | POA: Diagnosis not present

## 2020-03-29 DIAGNOSIS — G4709 Other insomnia: Secondary | ICD-10-CM | POA: Diagnosis not present

## 2020-04-03 ENCOUNTER — Other Ambulatory Visit: Payer: Self-pay | Admitting: Medical-Surgical

## 2020-04-03 ENCOUNTER — Other Ambulatory Visit (HOSPITAL_BASED_OUTPATIENT_CLINIC_OR_DEPARTMENT_OTHER): Payer: Self-pay | Admitting: Medical-Surgical

## 2020-04-03 DIAGNOSIS — Z1231 Encounter for screening mammogram for malignant neoplasm of breast: Secondary | ICD-10-CM

## 2020-04-04 DIAGNOSIS — N951 Menopausal and female climacteric states: Secondary | ICD-10-CM | POA: Diagnosis not present

## 2020-04-04 DIAGNOSIS — M2550 Pain in unspecified joint: Secondary | ICD-10-CM | POA: Diagnosis not present

## 2020-04-04 DIAGNOSIS — R232 Flushing: Secondary | ICD-10-CM | POA: Diagnosis not present

## 2020-04-05 DIAGNOSIS — K200 Eosinophilic esophagitis: Secondary | ICD-10-CM | POA: Diagnosis not present

## 2020-04-05 DIAGNOSIS — R1310 Dysphagia, unspecified: Secondary | ICD-10-CM | POA: Diagnosis not present

## 2020-04-06 ENCOUNTER — Other Ambulatory Visit: Payer: Self-pay

## 2020-04-06 ENCOUNTER — Ambulatory Visit (INDEPENDENT_AMBULATORY_CARE_PROVIDER_SITE_OTHER): Payer: BC Managed Care – PPO

## 2020-04-06 ENCOUNTER — Encounter: Payer: Self-pay | Admitting: Medical-Surgical

## 2020-04-06 ENCOUNTER — Ambulatory Visit (INDEPENDENT_AMBULATORY_CARE_PROVIDER_SITE_OTHER): Payer: BC Managed Care – PPO | Admitting: Medical-Surgical

## 2020-04-06 VITALS — BP 105/67 | HR 60 | Temp 98.0°F | Ht 67.5 in | Wt 184.3 lb

## 2020-04-06 DIAGNOSIS — Z7689 Persons encountering health services in other specified circumstances: Secondary | ICD-10-CM

## 2020-04-06 DIAGNOSIS — R635 Abnormal weight gain: Secondary | ICD-10-CM

## 2020-04-06 DIAGNOSIS — R5383 Other fatigue: Secondary | ICD-10-CM | POA: Diagnosis not present

## 2020-04-06 DIAGNOSIS — Z1231 Encounter for screening mammogram for malignant neoplasm of breast: Secondary | ICD-10-CM

## 2020-04-06 DIAGNOSIS — R232 Flushing: Secondary | ICD-10-CM | POA: Diagnosis not present

## 2020-04-06 DIAGNOSIS — Z1159 Encounter for screening for other viral diseases: Secondary | ICD-10-CM

## 2020-04-06 NOTE — Progress Notes (Signed)
New Patient Office Visit  Subjective:  Patient ID: Rebecca Morrow, female    DOB: [birth year]  Age: 49 y.o. MRN: [id number]  CC:  Chief Complaint  Patient presents with  . Establish Care    HPI [name] presents to establish care.   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 She does have a history of difficulty swallowing which is being worked up by [employer]. She will have an endoscopy in the next few weeks. Aside from that she has no other signs of autoimmune disorders. Denies skin concerns, new or unusual joint/muscle aches, fever, chills, shortness of breath.  Currently receiving care at [facility] for hot flashes. Gets testosterone implants placed every 3 months. Reports she was seen by them recently and hoped to get estrogen replacement but her estrogen levels were found to be elevated so they did not do this. Notes that she has had 12 to 14 pound weight gain over the last year despite being more active and more careful with her dietary intake.  Past Medical History:  Diagnosis Date  . Arthritis    lt knee  . Ganglion cyst of wrist    LT  . Lateral epicondylitis of right elbow 01/19/2019  . Left knee DJD   . Shoulder pain with history of repair of rotator cuff and biceps tenodesis on 10-09-2018 01/19/2019    Past Surgical History:  Procedure Laterality Date  . GANGLION CYST EXCISION  08/27/2012   Procedure: REMOVAL GANGLION OF WRIST;  Surgeon: Wyn Forster., MD;  Location: [facility];  Service: Orthopedics;  Laterality: Left;  EXCISION BIOPSY LEFT WRIST DORSAL GANGLION CYST  . JOINT REPLACEMENT     7  knee procedures  . KNEE ARTHROSCOPY     LT knee sx x6  . SHOULDER CLOSED REDUCTION Left 01/25/2019   Procedure: CLOSED MANIPULATION  LEFT SHOULDER;  Surgeon: Salvatore Marvel, MD;  Location: [facility];  Service: Orthopedics;  Laterality: Left;  left  . STERIOD  INJECTION Right 01/25/2019   Procedure: CORTIZONE  INJECTION IN RIGHT ELBOW;  Surgeon: Salvatore Marvel, MD;  Location: [facility];  Service: Orthopedics;  Laterality: Right;  right  . 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 TOTAL KNEE ARTHROPLASTY Left 03/29/2013   Procedure: TOTAL KNEE ARTHROPLASTY;  Surgeon: Nilda Simmer, MD;  Location: [facility];  Service: Orthopedics;  Laterality: Left;    Family History  Problem Relation Age of Onset  . Heart attack Father 70  . Hypertension Father   . Diabetes Mellitus II Mother   . Arthritis Mother   . Hypertension Maternal Grandmother   . Colon cancer Paternal Grandmother   . Allergic rhinitis Neg Hx   . Angioedema Neg Hx   . Asthma Neg Hx   . Eczema Neg Hx   . Immunodeficiency Neg Hx   . Urticaria Neg Hx     Social History   Socioeconomic History  . Marital status: Single    Spouse name: Not on file  . Number of children: Not on file  . Years of education: Not on file  . Highest education level: Not on file  Occupational History  . Not on file  Tobacco Use  . Smoking status: Never Smoker  . Smokeless tobacco: Never Used  Vaping Use  . Vaping Use: Never used  Substance and  Sexual Activity  . Alcohol use: Yes    Alcohol/week: 3.0 standard drinks    Types: 3 Cans of beer per week  . Drug use: No  . Sexual activity: Yes    Partners: Female    Birth control/protection: None    Comment: female partner  Other Topics Concern  . Not on file  Social History Narrative  . Not on file   Social Determinants of Health   Financial Resource Strain:   . Difficulty of Paying Living Expenses:   Food Insecurity:   . Worried About Charity fundraiser in the Last Year:   . Arboriculturist in the Last Year:   Transportation Needs:   . Film/video editor (Medical):   Marland Kitchen Lack of Transportation (Non-Medical):   Physical Activity:   . Days of Exercise per Week:   . Minutes of Exercise per Session:   Stress:    . Feeling of Stress :   Social Connections:   . Frequency of Communication with Friends and Family:   . Frequency of Social Gatherings with Friends and Family:   . Attends Religious Services:   . Active Member of Clubs or Organizations:   . Attends Archivist Meetings:   Marland Kitchen Marital Status:   Intimate Partner Violence:   . Fear of Current or Ex-Partner:   . Emotionally Abused:   Marland Kitchen Physically Abused:   . Sexually Abused:     ROS Review of Systems  Constitutional: Positive for fatigue (mild) and unexpected weight change. Negative for chills and fever.  Respiratory: Negative for cough, chest tightness, shortness of breath and wheezing.   Cardiovascular: Negative for chest pain, palpitations and leg swelling.  Gastrointestinal: Negative for abdominal pain, constipation, diarrhea and nausea.  Endocrine: Positive for heat intolerance (at night mainly).  Genitourinary: Negative for dysuria, frequency, urgency and vaginal discharge.  Psychiatric/Behavioral: Positive for sleep disturbance (hot flashes). Negative for dysphoric mood, self-injury and suicidal ideas. The patient is not nervous/anxious.     Objective:   Today's Vitals: BP 105/67   Pulse 60   Temp 98 F (36.7 C) (Oral)   Ht 5' 7.5" (1.715 m)   Wt 184 lb 4.8 oz (83.6 kg)   LMP 12/22/2016   SpO2 97%   BMI 28.44 kg/m   Physical Exam Vitals reviewed.  Constitutional:      General: She is not in acute distress.    Appearance: Normal appearance.  HENT:     Head: Normocephalic and atraumatic.  Cardiovascular:     Rate and Rhythm: Normal rate and regular rhythm.     Pulses: Normal pulses.     Heart sounds: Normal heart sounds. No murmur heard.  No friction rub. No gallop.   Pulmonary:     Effort: Pulmonary effort is normal. No respiratory distress.     Breath sounds: Normal breath sounds. No wheezing.  Skin:    General: Skin is warm and dry.  Neurological:     Mental Status: She is alert and oriented to  person, place, and time.  Psychiatric:        Mood and Affect: Mood normal.        Behavior: Behavior normal.        Thought Content: Thought content normal.        Judgment: Judgment normal.     Assessment & Plan:   1. Encounter to establish care Reviewed medical records and discussed history with patient.  Under [facility] of [facility]  as well as [facility].  We will request lab results from her other care providers to maintain a complete chart.  2. Weight gain/hot flashes/fatigue Checking CBC, CMP, lipid panel, and TSH today.  Symptoms may be related to menopause but she would like to rule out other possible concerns. - CBC - COMPLETE METABOLIC PANEL WITH GFR - Lipid panel - TSH  3. Need for hepatitis C screening test Discussed hepatitis C screening recommendations with patient.  She is amenable to having this done so we will have that added to blood work today. - Hepatitis C antibody   Outpatient Encounter Medications as of 04/06/2020  Medication Sig  . FLOVENT HFA 220 MCG/ACT inhaler Inhale 2 puffs into the lungs 2 (two) times daily as needed.   . Testosterone 100 MG PLLT 100 mg by Implant route every 3 (three) months.   No facility-administered encounter medications on file as of 04/06/2020.    Follow-up: Return in about 1 year (around 04/06/2021) for annual physical exam or sooner if needed.   Thayer Ohm, DNP, APRN, FNP-BC [facility]

## 2020-04-07 DIAGNOSIS — Z1159 Encounter for screening for other viral diseases: Secondary | ICD-10-CM | POA: Diagnosis not present

## 2020-04-07 DIAGNOSIS — R5383 Other fatigue: Secondary | ICD-10-CM | POA: Diagnosis not present

## 2020-04-07 DIAGNOSIS — R232 Flushing: Secondary | ICD-10-CM | POA: Diagnosis not present

## 2020-04-07 DIAGNOSIS — R635 Abnormal weight gain: Secondary | ICD-10-CM | POA: Diagnosis not present

## 2020-04-10 LAB — LIPID PANEL
Cholesterol: 149 mg/dL (ref <200)
HDL: 52 mg/dL (ref >=50)
LDL Cholesterol (Calc): 81 mg/dL (calc)
Non-HDL Cholesterol (Calc): 97 mg/dL (calc) (ref <130)
Total CHOL/HDL Ratio: 2.9 (calc) (ref <5.0)
Triglycerides: 80 mg/dL (ref <150)

## 2020-04-10 LAB — HEPATITIS C ANTIBODY
Hepatitis C Ab: NONREACTIVE
SIGNAL TO CUT-OFF: 0 (ref <1.00)

## 2020-04-10 LAB — CBC
HCT: 43.1 % (ref 35.0–45.0)
Hemoglobin: 14.3 g/dL (ref 11.7–15.5)
MCH: 30.6 pg (ref 27.0–33.0)
MCHC: 33.2 g/dL (ref 32.0–36.0)
MCV: 92.3 fL (ref 80.0–100.0)
MPV: 11.3 fL (ref 7.5–12.5)
Platelets: 242 10*3/uL (ref 140–400)
RBC: 4.67 10*6/uL (ref 3.80–5.10)
RDW: 12.6 % (ref 11.0–15.0)
WBC: 5.6 10*3/uL (ref 3.8–10.8)

## 2020-04-10 LAB — COMPLETE METABOLIC PANEL WITH GFR
AG Ratio: 1.9 (calc) (ref 1.0–2.5)
ALT: 11 U/L (ref 6–29)
AST: 16 U/L (ref 10–35)
Albumin: 4.4 g/dL (ref 3.6–5.1)
Alkaline phosphatase (APISO): 54 U/L (ref 37–153)
BUN: 15 mg/dL (ref 7–25)
CO2: 24 mmol/L (ref 20–32)
Calcium: 9 mg/dL (ref 8.6–10.4)
Chloride: 107 mmol/L (ref 98–110)
Creat: 0.75 mg/dL (ref 0.50–1.05)
GFR, Est African American: 107 mL/min/{1.73_m2} (ref >=60)
GFR, Est Non African American: 92 mL/min/{1.73_m2} (ref >=60)
Globulin: 2.3 g/dL (calc) (ref 1.9–3.7)
Glucose, Bld: 95 mg/dL (ref 65–99)
Potassium: 4.1 mmol/L (ref 3.5–5.3)
Sodium: 138 mmol/L (ref 135–146)
Total Bilirubin: 0.6 mg/dL (ref 0.2–1.2)
Total Protein: 6.7 g/dL (ref 6.1–8.1)

## 2020-04-10 LAB — TSH: TSH: 2.55 mIU/L

## 2020-04-25 IMAGING — MG DIGITAL SCREENING BILATERAL MAMMOGRAM WITH TOMO AND CAD
8 series · 8 of 24 positions shown · non-contrast
Comparison: Previous exam(s).

CLINICAL DATA: Screening.

EXAM:
DIGITAL SCREENING BILATERAL MAMMOGRAM WITH TOMO AND CAD

[L CC synth-2D]
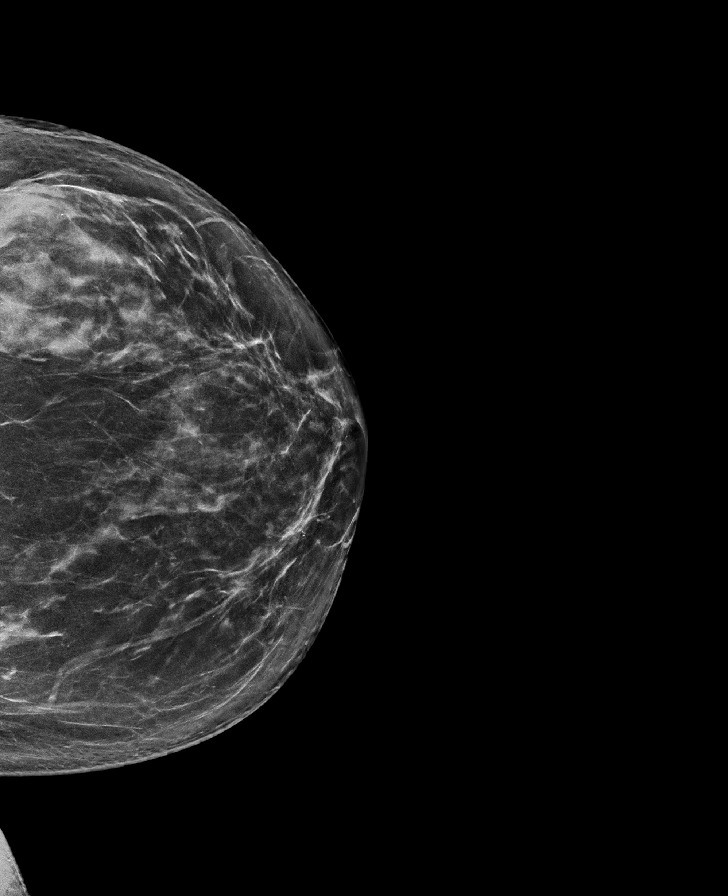

[R MLO synth-2D]
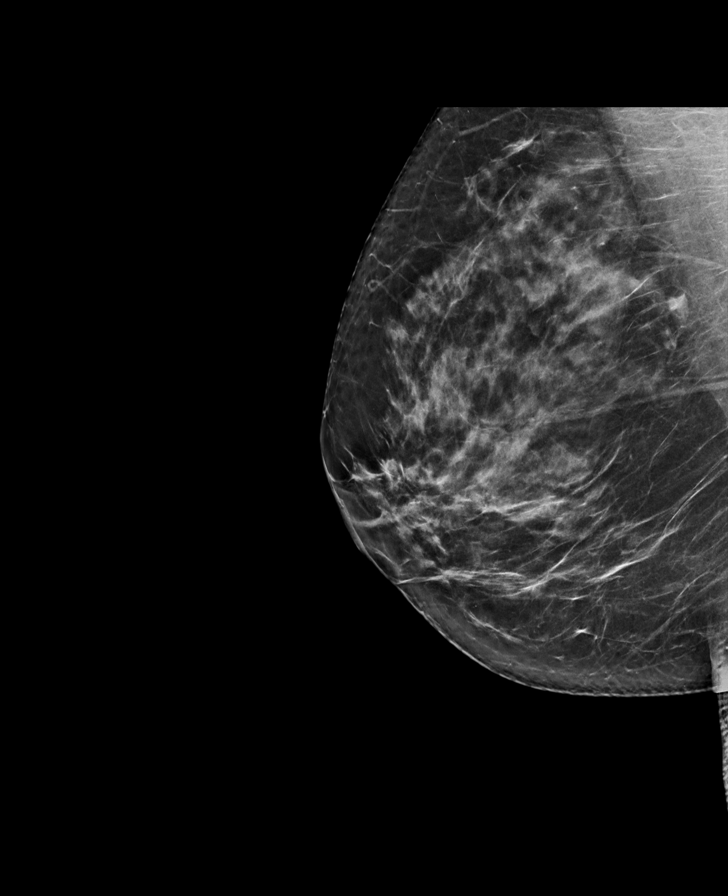

[R CC synth-2D]
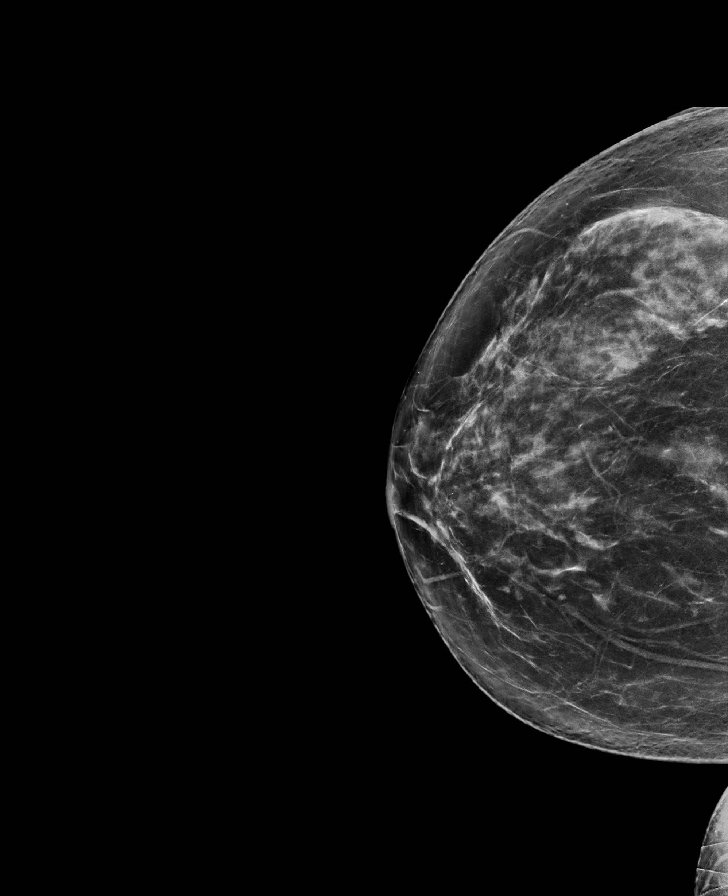

[L MLO synth-2D]
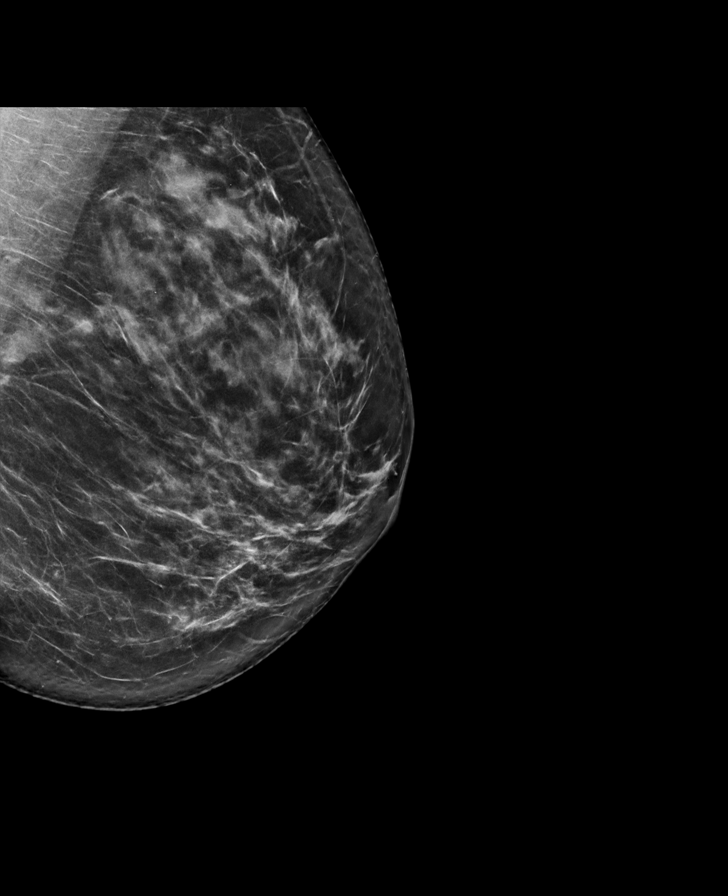

[L CC tomo · tomo slice 40/79.0]
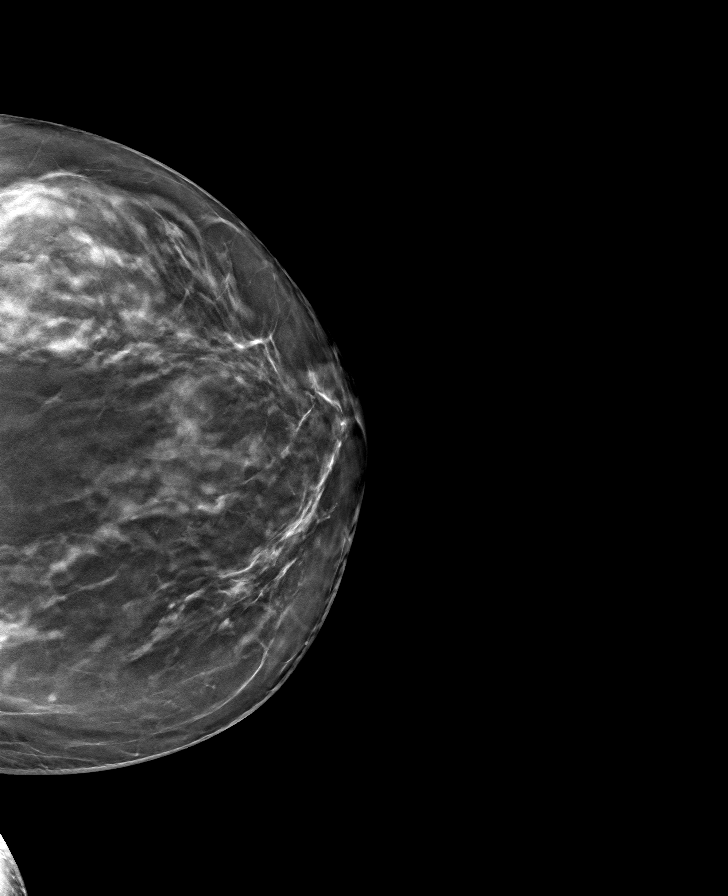

[L MLO tomo · tomo slice 41/80.0]
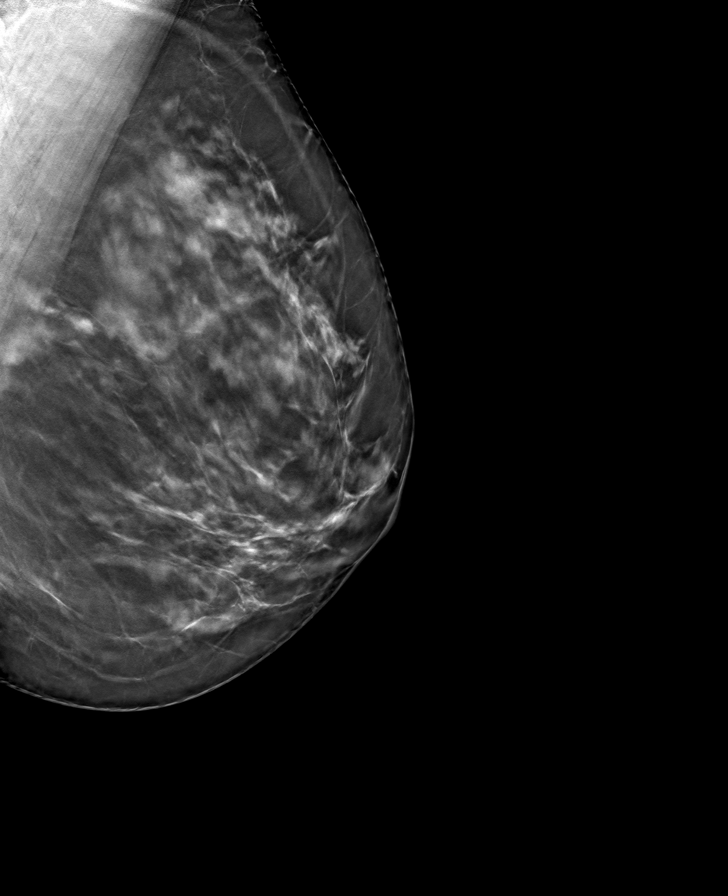

[R MLO tomo · tomo slice 41/80.0]
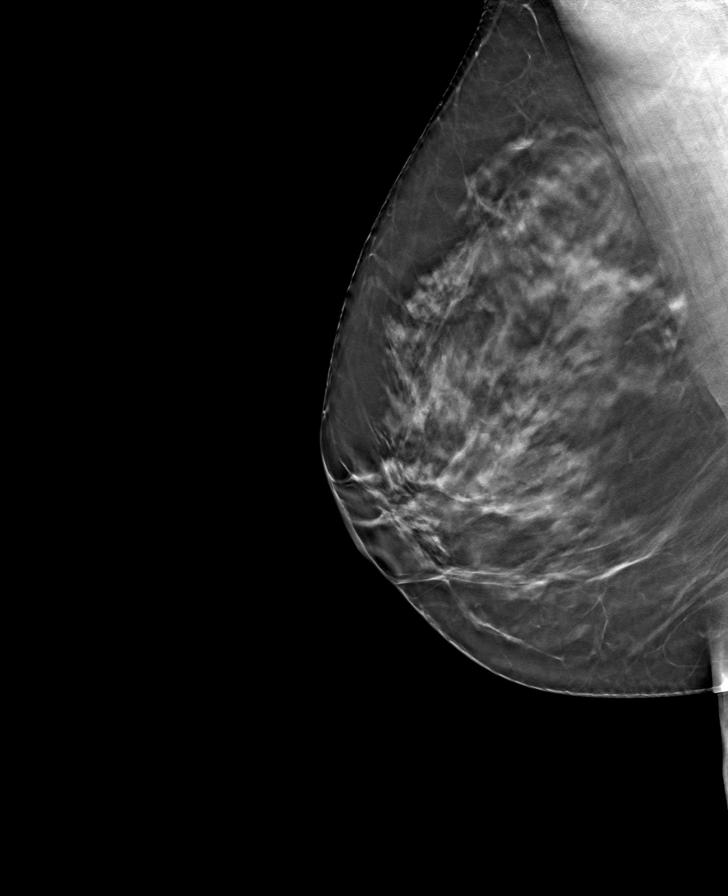

[R CC tomo · tomo slice 41/80.0]
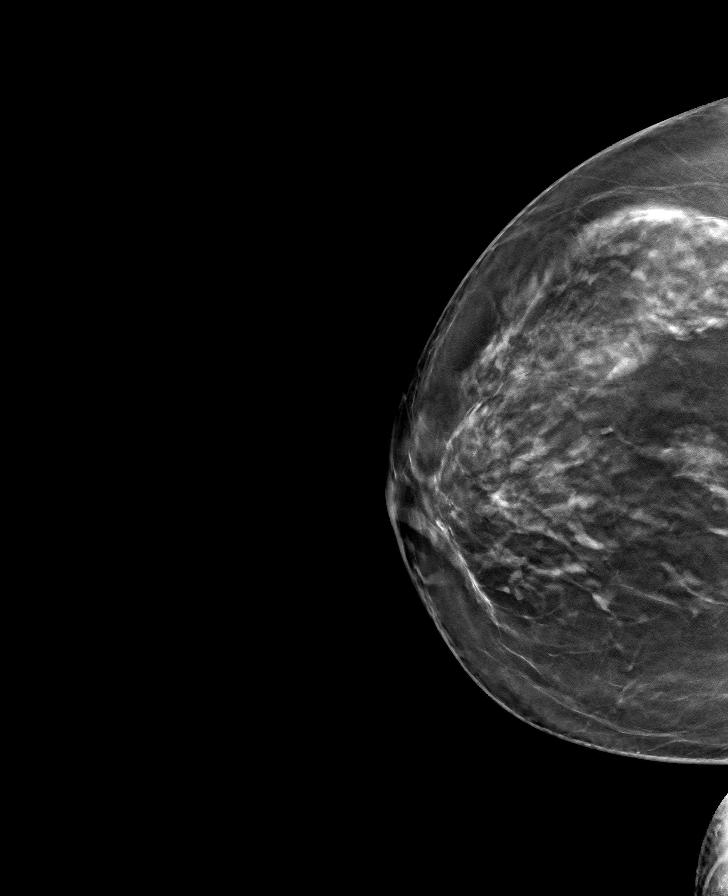

[8 of 24 positions shown; findings below may reference images not displayed]

ACR Breast Density Category c: The breast tissue is heterogeneously
dense, which may obscure small masses.
FINDINGS: There are no findings suspicious for malignancy. Images were
processed with CAD.
IMPRESSION: No mammographic evidence of malignancy. A result letter of this
screening mammogram will be mailed directly to the patient.

RECOMMENDATION:
Screening mammogram in one year. (Code:FT-U-LHB)

BI-RADS CATEGORY  1: Negative.

## 2020-05-04 ENCOUNTER — Other Ambulatory Visit: Payer: Self-pay

## 2020-05-04 ENCOUNTER — Ambulatory Visit (INDEPENDENT_AMBULATORY_CARE_PROVIDER_SITE_OTHER): Payer: BC Managed Care – PPO | Admitting: Orthotics

## 2020-05-04 DIAGNOSIS — M779 Enthesopathy, unspecified: Secondary | ICD-10-CM

## 2020-05-04 DIAGNOSIS — M2041 Other hammer toe(s) (acquired), right foot: Secondary | ICD-10-CM

## 2020-05-04 DIAGNOSIS — M25871 Other specified joint disorders, right ankle and foot: Secondary | ICD-10-CM

## 2020-05-04 DIAGNOSIS — M7741 Metatarsalgia, right foot: Secondary | ICD-10-CM

## 2020-05-04 NOTE — Progress Notes (Signed)
Patient cast today for foot orthotics to address 1) metatarsalagia 2) 2nd R capsulitis. Plan on hugging arch, met pads and offloading.

## 2020-05-25 DIAGNOSIS — K228 Other specified diseases of esophagus: Secondary | ICD-10-CM | POA: Diagnosis not present

## 2020-05-25 DIAGNOSIS — R1310 Dysphagia, unspecified: Secondary | ICD-10-CM | POA: Diagnosis not present

## 2020-05-25 DIAGNOSIS — K200 Eosinophilic esophagitis: Secondary | ICD-10-CM | POA: Diagnosis not present

## 2020-05-29 ENCOUNTER — Ambulatory Visit: Payer: BC Managed Care – PPO | Admitting: Orthotics

## 2020-05-29 ENCOUNTER — Other Ambulatory Visit: Payer: Self-pay

## 2020-05-29 DIAGNOSIS — M7741 Metatarsalgia, right foot: Secondary | ICD-10-CM

## 2020-05-29 DIAGNOSIS — M2041 Other hammer toe(s) (acquired), right foot: Secondary | ICD-10-CM

## 2020-05-29 DIAGNOSIS — M779 Enthesopathy, unspecified: Secondary | ICD-10-CM

## 2020-05-29 NOTE — Progress Notes (Signed)
Patient came in today to pick up custom made foot orthotics.  The goals were accomplished and the patient reported no dissatisfaction with said orthotics.  Patient was advised of breakin period and how to report any issues. 

## 2020-05-31 ENCOUNTER — Telehealth: Payer: Self-pay | Admitting: Sports Medicine

## 2020-05-31 NOTE — Telephone Encounter (Signed)
Pt left message yesterday asking about ordering a second pair of orthotics.  I returned call and left message for pt to call to discuss ordering a second pair of orthotics further.

## 2020-06-01 DIAGNOSIS — M7742 Metatarsalgia, left foot: Secondary | ICD-10-CM | POA: Diagnosis not present

## 2020-06-01 NOTE — Telephone Encounter (Signed)
Pt left message this morning returning my call.   I called back and she is wanting to go ahead and order a second pair of orthotics but ones that are for her dress shoes(3/4 length) and is going to pay the 219.00 price for the second pair. I told pt I would call when they come in.

## 2020-07-10 DIAGNOSIS — R232 Flushing: Secondary | ICD-10-CM | POA: Diagnosis not present

## 2020-07-10 DIAGNOSIS — N951 Menopausal and female climacteric states: Secondary | ICD-10-CM | POA: Diagnosis not present

## 2020-07-13 ENCOUNTER — Ambulatory Visit: Payer: BC Managed Care – PPO | Admitting: Sports Medicine

## 2020-07-13 ENCOUNTER — Encounter: Payer: Self-pay | Admitting: Sports Medicine

## 2020-07-13 ENCOUNTER — Other Ambulatory Visit: Payer: Self-pay

## 2020-07-13 DIAGNOSIS — M79671 Pain in right foot: Secondary | ICD-10-CM

## 2020-07-13 DIAGNOSIS — R232 Flushing: Secondary | ICD-10-CM | POA: Diagnosis not present

## 2020-07-13 DIAGNOSIS — G4709 Other insomnia: Secondary | ICD-10-CM | POA: Diagnosis not present

## 2020-07-13 DIAGNOSIS — K200 Eosinophilic esophagitis: Secondary | ICD-10-CM | POA: Diagnosis not present

## 2020-07-13 DIAGNOSIS — N951 Menopausal and female climacteric states: Secondary | ICD-10-CM | POA: Diagnosis not present

## 2020-07-13 DIAGNOSIS — M25871 Other specified joint disorders, right ankle and foot: Secondary | ICD-10-CM

## 2020-07-13 DIAGNOSIS — M779 Enthesopathy, unspecified: Secondary | ICD-10-CM | POA: Diagnosis not present

## 2020-07-13 DIAGNOSIS — M7741 Metatarsalgia, right foot: Secondary | ICD-10-CM | POA: Diagnosis not present

## 2020-07-13 DIAGNOSIS — M2041 Other hammer toe(s) (acquired), right foot: Secondary | ICD-10-CM

## 2020-07-13 DIAGNOSIS — Z86010 Personal history of colonic polyps: Secondary | ICD-10-CM | POA: Diagnosis not present

## 2020-07-13 DIAGNOSIS — R1310 Dysphagia, unspecified: Secondary | ICD-10-CM | POA: Diagnosis not present

## 2020-07-13 MED ORDER — TRIAMCINOLONE ACETONIDE 10 MG/ML IJ SUSP
10.0000 mg | Freq: Once | INTRAMUSCULAR | Status: AC
  Administered 2020-07-13: 10 mg

## 2020-07-13 MED ORDER — DICLOFENAC SODIUM 75 MG PO TBEC: 75.0000 mg | DELAYED_RELEASE_TABLET | Freq: Two times a day (BID) | ORAL | 0 refills | Status: DC

## 2020-07-13 NOTE — Progress Notes (Signed)
Subjective: Rebecca Morrow is a 49 y.o. female patient who returns office for follow-up evaluation of right foot pain.  Patient reports that she still has pain and the orthotics have not helped. Reports pain at 2nd and 3rd toes on the right with pain at the ball worse when loading or activities. Reports pain is worse with walking and previous injection seems like it has worn off. No other issues noted.   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      Testosterone 100 MG PLLT 100 mg by Implant route every 3 (three) months.     No current facility-administered medications on file prior to visit.    No Known Allergies  Objective:  General: Alert and oriented x3 in no acute distress  Dermatology: No open lesions bilateral lower extremities, no webspace macerations, old surgical scar right first ray well-healed.  No ecchymosis bilateral, all nails x 10 are well manicured.  Minimal plantar callus right foot.  Vascular: Dorsalis Pedis and Posterior Tibial pedal pulses palpable, Capillary Fill Time 3 seconds,(+) pedal hair growth bilateral, no edema bilateral lower extremities, Temperature gradient within normal limits.  Neurology: Michaell Cowing sensation intact via light touch bilateral.  Musculoskeletal:+ tenderness with palpation at plantar second metatarsophalangeal joint on the right foot.  There is also mild digital contracture of the second toe joint pain with pain with manipulation of the second>third toe joint on the right,  there is no frank instability around the right second toe joint however there is mild swelling and pain with early plantar callus noted to the right forefoot supportive of the mechanical overload to this area that is slightly improved from prior.  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 Post-injection care discussed with patient.  -Rx Diclofenac -Dispensed CAM boot -Avoid strenous activities that could worsen symptoms and advised rest, ice, elevation -Return to office in 3 weeks advised patient if symptoms fail to improve may benefit from surgery   Asencion Islam, DPM

## 2020-08-24 ENCOUNTER — Ambulatory Visit: Payer: BC Managed Care – PPO | Admitting: Sports Medicine

## 2020-10-16 DIAGNOSIS — N951 Menopausal and female climacteric states: Secondary | ICD-10-CM | POA: Diagnosis not present

## 2020-10-19 DIAGNOSIS — R232 Flushing: Secondary | ICD-10-CM | POA: Diagnosis not present

## 2020-10-19 DIAGNOSIS — N951 Menopausal and female climacteric states: Secondary | ICD-10-CM | POA: Diagnosis not present

## 2020-10-19 DIAGNOSIS — M2550 Pain in unspecified joint: Secondary | ICD-10-CM | POA: Diagnosis not present

## 2020-11-15 DIAGNOSIS — D3131 Benign neoplasm of right choroid: Secondary | ICD-10-CM | POA: Diagnosis not present

## 2020-12-05 DIAGNOSIS — L814 Other melanin hyperpigmentation: Secondary | ICD-10-CM | POA: Diagnosis not present

## 2020-12-05 DIAGNOSIS — D225 Melanocytic nevi of trunk: Secondary | ICD-10-CM | POA: Diagnosis not present

## 2020-12-05 DIAGNOSIS — L821 Other seborrheic keratosis: Secondary | ICD-10-CM | POA: Diagnosis not present

## 2020-12-05 DIAGNOSIS — X32XXXS Exposure to sunlight, sequela: Secondary | ICD-10-CM | POA: Diagnosis not present

## 2021-01-04 DIAGNOSIS — M9901 Segmental and somatic dysfunction of cervical region: Secondary | ICD-10-CM | POA: Diagnosis not present

## 2021-01-04 DIAGNOSIS — F0781 Postconcussional syndrome: Secondary | ICD-10-CM | POA: Diagnosis not present

## 2021-01-04 DIAGNOSIS — M9907 Segmental and somatic dysfunction of upper extremity: Secondary | ICD-10-CM | POA: Diagnosis not present

## 2021-01-04 DIAGNOSIS — M9902 Segmental and somatic dysfunction of thoracic region: Secondary | ICD-10-CM | POA: Diagnosis not present

## 2021-01-10 DIAGNOSIS — M9902 Segmental and somatic dysfunction of thoracic region: Secondary | ICD-10-CM | POA: Diagnosis not present

## 2021-01-10 DIAGNOSIS — M9907 Segmental and somatic dysfunction of upper extremity: Secondary | ICD-10-CM | POA: Diagnosis not present

## 2021-01-10 DIAGNOSIS — M9901 Segmental and somatic dysfunction of cervical region: Secondary | ICD-10-CM | POA: Diagnosis not present

## 2021-01-10 DIAGNOSIS — F0781 Postconcussional syndrome: Secondary | ICD-10-CM | POA: Diagnosis not present

## 2021-01-11 DIAGNOSIS — F0781 Postconcussional syndrome: Secondary | ICD-10-CM | POA: Diagnosis not present

## 2021-01-11 DIAGNOSIS — M9902 Segmental and somatic dysfunction of thoracic region: Secondary | ICD-10-CM | POA: Diagnosis not present

## 2021-01-11 DIAGNOSIS — M9901 Segmental and somatic dysfunction of cervical region: Secondary | ICD-10-CM | POA: Diagnosis not present

## 2021-01-11 DIAGNOSIS — M9907 Segmental and somatic dysfunction of upper extremity: Secondary | ICD-10-CM | POA: Diagnosis not present

## 2021-01-18 DIAGNOSIS — M9901 Segmental and somatic dysfunction of cervical region: Secondary | ICD-10-CM | POA: Diagnosis not present

## 2021-01-18 DIAGNOSIS — F0781 Postconcussional syndrome: Secondary | ICD-10-CM | POA: Diagnosis not present

## 2021-01-18 DIAGNOSIS — M9902 Segmental and somatic dysfunction of thoracic region: Secondary | ICD-10-CM | POA: Diagnosis not present

## 2021-01-18 DIAGNOSIS — M9907 Segmental and somatic dysfunction of upper extremity: Secondary | ICD-10-CM | POA: Diagnosis not present

## 2021-01-25 ENCOUNTER — Encounter: Payer: Self-pay | Admitting: Sports Medicine

## 2021-01-25 ENCOUNTER — Other Ambulatory Visit: Payer: Self-pay

## 2021-01-25 ENCOUNTER — Ambulatory Visit: Payer: BC Managed Care – PPO | Admitting: Sports Medicine

## 2021-01-25 DIAGNOSIS — M2041 Other hammer toe(s) (acquired), right foot: Secondary | ICD-10-CM | POA: Diagnosis not present

## 2021-01-25 DIAGNOSIS — M7751 Other enthesopathy of right foot: Secondary | ICD-10-CM

## 2021-01-25 DIAGNOSIS — M79671 Pain in right foot: Secondary | ICD-10-CM

## 2021-01-25 DIAGNOSIS — M7741 Metatarsalgia, right foot: Secondary | ICD-10-CM | POA: Diagnosis not present

## 2021-01-25 DIAGNOSIS — M25871 Other specified joint disorders, right ankle and foot: Secondary | ICD-10-CM | POA: Diagnosis not present

## 2021-01-25 DIAGNOSIS — M779 Enthesopathy, unspecified: Secondary | ICD-10-CM

## 2021-01-25 MED ORDER — TRIAMCINOLONE ACETONIDE 10 MG/ML IJ SUSP
10.0000 mg | Freq: Once | INTRAMUSCULAR | Status: AC
  Administered 2021-01-25: 10 mg

## 2021-01-25 NOTE — Progress Notes (Signed)
Subjective: Rebecca Morrow is a 50 y.o. female patient who returns office for follow-up evaluation of right foot pain.  Patient reports that pain has flared back up since feb. Injection helped some but pain is bad that it makes her limp. No other issues noted.   Patient Active Problem List   Diagnosis Date Noted  . Eosinophilic esophagitis 12/02/2019  . Intolerance, food 12/02/2019  . Left Shoulder pain with history of repair of rotator cuff and biceps tenodesis on 10-09-2018 01/19/2019  . Routine general medical examination at a health care facility 10/12/2015  . 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 budesonide (PULMICORT) 0.5 MG/2ML nebulizer solution 2 ml    . esomeprazole (NEXIUM) 20 MG capsule 1 capsule    . fluticasone (FLOVENT HFA) 220 MCG/ACT inhaler 2 puffs    . diclofenac (VOLTAREN) 75 MG EC tablet Take 1 tablet (75 mg total) by mouth 2 (two) times daily. 30 tablet 0  . FLOVENT HFA 220 MCG/ACT inhaler Inhale 2 puffs into the lungs 2 (two) times daily as needed.     . Testosterone 100 MG PLLT 100 mg by Implant route every 3 (three) months.     No current facility-administered medications on file prior to visit.    No Known Allergies  Objective:  General: Alert and oriented x3 in no acute distress  Dermatology: No open lesions bilateral lower extremities, no webspace macerations, old surgical scar right first ray well-healed.  No ecchymosis bilateral, all nails x 10 are well manicured.  Minimal plantar callus right foot.  Vascular: Dorsalis Pedis and Posterior Tibial pedal pulses palpable, Capillary Fill Time 3 seconds,(+) pedal hair growth bilateral, no edema bilateral lower extremities, Temperature gradient within normal limits.  Neurology: Michaell Cowing sensation intact via light touch bilateral.  Musculoskeletal:+ tenderness with palpation at plantar second metatarsophalangeal joint on the right foot.   There is also mild digital contracture of the second toe joint pain with pain with manipulation of the second toe joint on the right, there is no frank instability around the right second toe joint however there is mild swelling and pain with early plantar callus noted to the right forefoot supportive of the mechanical overload to this area like before.   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 Post-injection care discussed with patient.  -Patient opt for surgical management. Consent obtained for right 2nd hammertoe with internal fixation, and 2nd metatarsal osteotomy and possible ligament repair. Pre and Post op course explained. Risks, benefits, alternatives explained. No guarantees given or implied. Surgical booking slip submitted and provided patient with Surgical packet and info for [facility] -To dispense wedge  shoe at surgery center -Avoid strenous activities that could worsen symptoms and advised rest, ice, elevation meanwhile -Return to office after surgery or sooner.  Asencion Islam, DPM

## 2021-01-30 DIAGNOSIS — M9901 Segmental and somatic dysfunction of cervical region: Secondary | ICD-10-CM | POA: Diagnosis not present

## 2021-01-30 DIAGNOSIS — F0781 Postconcussional syndrome: Secondary | ICD-10-CM | POA: Diagnosis not present

## 2021-01-30 DIAGNOSIS — M9902 Segmental and somatic dysfunction of thoracic region: Secondary | ICD-10-CM | POA: Diagnosis not present

## 2021-01-30 DIAGNOSIS — M9907 Segmental and somatic dysfunction of upper extremity: Secondary | ICD-10-CM | POA: Diagnosis not present

## 2021-02-05 ENCOUNTER — Telehealth: Payer: Self-pay | Admitting: Urology

## 2021-02-05 DIAGNOSIS — M9901 Segmental and somatic dysfunction of cervical region: Secondary | ICD-10-CM | POA: Diagnosis not present

## 2021-02-05 DIAGNOSIS — M9902 Segmental and somatic dysfunction of thoracic region: Secondary | ICD-10-CM | POA: Diagnosis not present

## 2021-02-05 DIAGNOSIS — F0781 Postconcussional syndrome: Secondary | ICD-10-CM | POA: Diagnosis not present

## 2021-02-05 DIAGNOSIS — M9907 Segmental and somatic dysfunction of upper extremity: Secondary | ICD-10-CM | POA: Diagnosis not present

## 2021-02-05 NOTE — Telephone Encounter (Signed)
DOS - 02/26/21  METATARSAL OSTEOTOMY 2ND RIGHT --- 28308 HAMMERTOE REPAIR 2ND RIGHT --- 70340 REDISLOCATION SYNDROME RIGHT --- 35248  [employer] EFFECTIVE DATE - 02/05/21   PLAN DEDUCTIBLE - $250.00 W/ $250.00 REMAINING OUT OF POCKET - $2,000.00 W/ $1,843.00 REMAINING COINSURANCE - 0% COPAY - $0.00   SPOKE WITH JILL WITH [employer] AND SHE STATED THAT NO PRIOR AUTH IS REQUIRED FOR CPT CODES 18590, 93112 AND  [phone]. REF # [id number]

## 2021-02-25 ENCOUNTER — Other Ambulatory Visit: Payer: Self-pay | Admitting: Sports Medicine

## 2021-02-25 NOTE — Progress Notes (Signed)
Post op meds entered 

## 2021-02-26 ENCOUNTER — Encounter: Payer: Self-pay | Admitting: Sports Medicine

## 2021-02-26 DIAGNOSIS — M7751 Other enthesopathy of right foot: Secondary | ICD-10-CM | POA: Diagnosis not present

## 2021-02-26 DIAGNOSIS — M25871 Other specified joint disorders, right ankle and foot: Secondary | ICD-10-CM | POA: Diagnosis not present

## 2021-02-26 DIAGNOSIS — M25571 Pain in right ankle and joints of right foot: Secondary | ICD-10-CM | POA: Diagnosis not present

## 2021-02-26 DIAGNOSIS — M21541 Acquired clubfoot, right foot: Secondary | ICD-10-CM | POA: Diagnosis not present

## 2021-02-26 DIAGNOSIS — M2041 Other hammer toe(s) (acquired), right foot: Secondary | ICD-10-CM | POA: Diagnosis not present

## 2021-02-26 DIAGNOSIS — M7741 Metatarsalgia, right foot: Secondary | ICD-10-CM | POA: Diagnosis not present

## 2021-02-26 MED ORDER — PROMETHAZINE HCL 12.5 MG PO TABS
12.5000 mg | ORAL_TABLET | Freq: Three times a day (TID) | ORAL | 0 refills | Status: DC | PRN
Start: 2021-02-26 — End: 2021-07-16

## 2021-02-26 MED ORDER — HYDROCODONE-ACETAMINOPHEN 10-325 MG PO TABS: 1.0000 | ORAL_TABLET | Freq: Four times a day (QID) | ORAL | 0 refills | Status: AC | PRN

## 2021-02-26 MED ORDER — DOCUSATE SODIUM 100 MG PO CAPS: 100.0000 mg | ORAL_CAPSULE | Freq: Two times a day (BID) | ORAL | 0 refills | Status: DC

## 2021-02-26 MED ORDER — IBUPROFEN 800 MG PO TABS: 800.0000 mg | ORAL_TABLET | Freq: Three times a day (TID) | ORAL | 0 refills | Status: DC | PRN

## 2021-02-28 ENCOUNTER — Telehealth: Payer: Self-pay | Admitting: Sports Medicine

## 2021-02-28 NOTE — Telephone Encounter (Signed)
Post op check phone call made to patient.  Patient reports that she is doing well.  Patient states that her numbing medicine wore off Tuesday morning and has been taking ibuprofen every 6.  Patient denies any current pain or symptoms.  Reports that she has been resting icing and elevating as previously instructed.  I advised patient that she will follow-up on next week and Dr. Lilian Kapur will do her first postoperative visit of a dressing change and x-rays.  I informed patient that she has hardware in the second toe and the metatarsal and also explained to her that I did a ligament repair using suture to help stabilize the second toe joint.  Patient expressed understanding and thanked me for calling.  I have asked patient to call office back if there are any other postoperative questions or concerns.

## 2021-03-08 ENCOUNTER — Ambulatory Visit (INDEPENDENT_AMBULATORY_CARE_PROVIDER_SITE_OTHER): Payer: BC Managed Care – PPO

## 2021-03-08 ENCOUNTER — Ambulatory Visit (INDEPENDENT_AMBULATORY_CARE_PROVIDER_SITE_OTHER): Payer: BC Managed Care – PPO | Admitting: Podiatry

## 2021-03-08 ENCOUNTER — Other Ambulatory Visit: Payer: Self-pay

## 2021-03-08 DIAGNOSIS — M779 Enthesopathy, unspecified: Secondary | ICD-10-CM

## 2021-03-08 DIAGNOSIS — Z9889 Other specified postprocedural states: Secondary | ICD-10-CM

## 2021-03-08 DIAGNOSIS — M7741 Metatarsalgia, right foot: Secondary | ICD-10-CM

## 2021-03-08 DIAGNOSIS — M2041 Other hammer toe(s) (acquired), right foot: Secondary | ICD-10-CM

## 2021-03-09 ENCOUNTER — Encounter: Payer: Self-pay | Admitting: Podiatry

## 2021-03-09 NOTE — Progress Notes (Signed)
Subjective:  Patient ID: Rebecca Morrow, female    DOB: 1971/05/02,  MRN: [id number]  Chief Complaint  Patient presents with  . Routine Post Op    PT stated that she is doing well she does have some mild pain but no major concerns at this time      49 y.o. female returns for post-op check.  Patient presents with status post right second digit hammertoe repair with capsulotomy of the MPJ.  Patient states that she is doing well.  She does not have much pain.  She denies any other acute complaints Dr. Marylene Land to the procedure no nausea fever chills vomiting pain well controlled.  Ambulating with a cam boot  Review of Systems: Negative except as noted in the HPI. Denies N/V/F/Ch.  Past Medical History:  Diagnosis Date  . Arthritis    lt knee  . Ganglion cyst of wrist    LT  . Lateral epicondylitis of right elbow 01/19/2019  . Left knee DJD   . Shoulder pain with history of repair of rotator cuff and biceps tenodesis on 10-09-2018 01/19/2019    Current Outpatient Medications:  .  budesonide (PULMICORT) 0.5 MG/2ML nebulizer solution, 2 ml, Disp: , Rfl:  .  diclofenac (VOLTAREN) 75 MG EC tablet, Take 1 tablet (75 mg total) by mouth 2 (two) times daily., Disp: 30 tablet, Rfl: 0 .  docusate sodium (COLACE) 100 MG capsule, Take 1 capsule (100 mg total) by mouth 2 (two) times daily., Disp: 10 capsule, Rfl: 0 .  esomeprazole (NEXIUM) 20 MG capsule, 1 capsule, Disp: , Rfl:  .  FLOVENT HFA 220 MCG/ACT inhaler, Inhale 2 puffs into the lungs 2 (two) times daily as needed. , Disp: , Rfl:  .  fluticasone (FLOVENT HFA) 220 MCG/ACT inhaler, 2 puffs, Disp: , Rfl:  .  ibuprofen (ADVIL) 800 MG tablet, Take 1 tablet (800 mg total) by mouth every 8 (eight) hours as needed., Disp: 30 tablet, Rfl: 0 .  promethazine (PHENERGAN) 12.5 MG tablet, Take 1 tablet (12.5 mg total) by mouth every 8 (eight) hours as needed for nausea or vomiting., Disp: 20 tablet, Rfl: 0 .  Testosterone 100 MG PLLT, 100 mg by Implant route  every 3 (three) months., Disp: , Rfl:   Social History   Tobacco Use  Smoking Status Never Smoker  Smokeless Tobacco Never Used    No Known Allergies Objective:  There were no vitals filed for this visit. There is no height or weight on file to calculate BMI. Constitutional Well developed. Well nourished.  Vascular Foot warm and well perfused. Capillary refill normal to all digits.   Neurologic Normal speech. Oriented to person, place, and time. Epicritic sensation to light touch grossly present bilaterally.  Dermatologic Skin healing well without signs of infection. Skin edges well coapted without signs of infection.  Orthopedic: Tenderness to palpation noted about the surgical site.   Hardware is intact without any sign of loosening or backing.  Good correction alignment noted..  Previous bunion deformity hardware noted.  No other bony abnormalities identified. Radiographs: 3 views of skeletally mature the right foot: Assessment:   1. Status post surgery   2. Hammertoe of right foot   3. Metatarsalgia, right foot   4. Capsulitis    Plan:  Patient was evaluated and treated and all questions answered.  S/p foot surgery right -Progressing as expected post-operatively. -XR: See above -WB Status: Weightbearing as tolerated in cam boot -Sutures: Intact.  No clinical signs of dehiscence noted.  No complication noted. -Medications: None -Foot redressed.  No follow-ups on file.

## 2021-03-15 ENCOUNTER — Other Ambulatory Visit: Payer: Self-pay

## 2021-03-15 ENCOUNTER — Ambulatory Visit (INDEPENDENT_AMBULATORY_CARE_PROVIDER_SITE_OTHER): Payer: BC Managed Care – PPO | Admitting: Sports Medicine

## 2021-03-15 ENCOUNTER — Encounter: Payer: Self-pay | Admitting: Sports Medicine

## 2021-03-15 DIAGNOSIS — R1310 Dysphagia, unspecified: Secondary | ICD-10-CM | POA: Insufficient documentation

## 2021-03-15 DIAGNOSIS — K5900 Constipation, unspecified: Secondary | ICD-10-CM | POA: Insufficient documentation

## 2021-03-15 DIAGNOSIS — Z9889 Other specified postprocedural states: Secondary | ICD-10-CM

## 2021-03-15 DIAGNOSIS — R109 Unspecified abdominal pain: Secondary | ICD-10-CM | POA: Insufficient documentation

## 2021-03-15 DIAGNOSIS — M7741 Metatarsalgia, right foot: Secondary | ICD-10-CM

## 2021-03-15 DIAGNOSIS — Z860100 Personal history of colon polyps, unspecified: Secondary | ICD-10-CM | POA: Insufficient documentation

## 2021-03-15 DIAGNOSIS — M2041 Other hammer toe(s) (acquired), right foot: Secondary | ICD-10-CM

## 2021-03-15 DIAGNOSIS — K219 Gastro-esophageal reflux disease without esophagitis: Secondary | ICD-10-CM | POA: Insufficient documentation

## 2021-03-15 DIAGNOSIS — M779 Enthesopathy, unspecified: Secondary | ICD-10-CM

## 2021-03-15 DIAGNOSIS — M25871 Other specified joint disorders, right ankle and foot: Secondary | ICD-10-CM

## 2021-03-15 DIAGNOSIS — M79671 Pain in right foot: Secondary | ICD-10-CM

## 2021-03-15 MED ORDER — IBUPROFEN 800 MG PO TABS: 800.0000 mg | ORAL_TABLET | Freq: Three times a day (TID) | ORAL | 0 refills | Status: DC | PRN

## 2021-03-15 NOTE — Progress Notes (Signed)
Subjective: Rebecca Morrow is a 50 y.o. female patient seen today in office for POV #2 (DOS 02/26/21), S/P right 2nd hammertoe with 2nd MTPJ ligament repair and met osteotomy. Patient denies pain at surgical site except some occasional tingling and numbness, denies calf pain, denies headache, chest pain, shortness of breath, vomiting, fever, or chills.  Except an episode of nausea after last visit.  Patient is wondering about the positioning of the toe sometimes it seems overextended.  No other issues noted.   Patient Active Problem List   Diagnosis Date Noted  . Eosinophilic esophagitis 50/12/7046  . Intolerance, food 12/02/2019  . Left Shoulder pain with history of repair of rotator cuff and biceps tenodesis on 10-09-2018 01/19/2019  . Routine general medical examination at a health care facility 10/12/2015  . 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 budesonide (PULMICORT) 0.5 MG/2ML nebulizer solution 2 ml    . diclofenac (VOLTAREN) 75 MG EC tablet Take 1 tablet (75 mg total) by mouth 2 (two) times daily. 30 tablet 0  . docusate sodium (COLACE) 100 MG capsule Take 1 capsule (100 mg total) by mouth 2 (two) times daily. 10 capsule 0  . esomeprazole (NEXIUM) 20 MG capsule 1 capsule    . FLOVENT HFA 220 MCG/ACT inhaler Inhale 2 puffs into the lungs 2 (two) times daily as needed.     . fluticasone (FLOVENT HFA) 220 MCG/ACT inhaler 2 puffs    . ibuprofen (ADVIL) 800 MG tablet Take 1 tablet (800 mg total) by mouth every 8 (eight) hours as needed. 30 tablet 0  . promethazine (PHENERGAN) 12.5 MG tablet Take 1 tablet (12.5 mg total) by mouth every 8 (eight) hours as needed for nausea or vomiting. 20 tablet 0  . Testosterone 100 MG PLLT 100 mg by Implant route every 3 (three) months.     No current facility-administered medications on file prior to visit.    No Known Allergies  Objective: There were no vitals filed for this  visit.  General: No acute distress, AAOx3  Right foot: Sutures intact with no gapping or dehiscence at surgical site, there is mild maceration at the second toe ,mild swelling to right  foot, no erythema, no warmth, no drainage, no signs of infection noted, Capillary fill time <3 seconds in all digits, gross sensation present via light touch to right foot. No pain or crepitation with range of motion right foot.  Subjective numbness and tingling worse at night to right foot especially at surgical site.  No pain with calf compression.   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 In the meantime, patient to call office if any issues or problems arise.  Advised patient if there is any scar contracture we may have to add on Darco toe alignment splint at some point.  Landis Martins, DPM

## 2021-03-19 DIAGNOSIS — F0781 Postconcussional syndrome: Secondary | ICD-10-CM | POA: Diagnosis not present

## 2021-03-19 DIAGNOSIS — M9902 Segmental and somatic dysfunction of thoracic region: Secondary | ICD-10-CM | POA: Diagnosis not present

## 2021-03-19 DIAGNOSIS — M9907 Segmental and somatic dysfunction of upper extremity: Secondary | ICD-10-CM | POA: Diagnosis not present

## 2021-03-19 DIAGNOSIS — M9901 Segmental and somatic dysfunction of cervical region: Secondary | ICD-10-CM | POA: Diagnosis not present

## 2021-03-23 ENCOUNTER — Encounter: Payer: Self-pay | Admitting: Sports Medicine

## 2021-03-29 ENCOUNTER — Ambulatory Visit (INDEPENDENT_AMBULATORY_CARE_PROVIDER_SITE_OTHER): Payer: BC Managed Care – PPO | Admitting: Sports Medicine

## 2021-03-29 ENCOUNTER — Other Ambulatory Visit: Payer: Self-pay

## 2021-03-29 ENCOUNTER — Ambulatory Visit (INDEPENDENT_AMBULATORY_CARE_PROVIDER_SITE_OTHER): Payer: BC Managed Care – PPO

## 2021-03-29 ENCOUNTER — Encounter: Payer: Self-pay | Admitting: Sports Medicine

## 2021-03-29 DIAGNOSIS — M779 Enthesopathy, unspecified: Secondary | ICD-10-CM

## 2021-03-29 DIAGNOSIS — Z9889 Other specified postprocedural states: Secondary | ICD-10-CM

## 2021-03-29 DIAGNOSIS — M79671 Pain in right foot: Secondary | ICD-10-CM

## 2021-03-29 DIAGNOSIS — M25871 Other specified joint disorders, right ankle and foot: Secondary | ICD-10-CM

## 2021-03-29 DIAGNOSIS — M7741 Metatarsalgia, right foot: Secondary | ICD-10-CM

## 2021-03-29 DIAGNOSIS — L905 Scar conditions and fibrosis of skin: Secondary | ICD-10-CM

## 2021-03-29 DIAGNOSIS — M2041 Other hammer toe(s) (acquired), right foot: Secondary | ICD-10-CM

## 2021-03-29 NOTE — Progress Notes (Addendum)
Subjective: Rebecca Morrow is a 50 y.o. female patient seen today in office for POV #3 (DOS 02/26/21), S/P right 2nd hammertoe with 2nd MTPJ ligament repair and met osteotomy. Patient denies pain at surgical site except some occasional tingling and numbness like before with minimal pain that is manageable with ibuprofen, denies calf pain, denies headache, chest pain, shortness of breath, vomiting, fever, or chills.  No other issues noted.   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 30 tablet 0   docusate sodium (COLACE) 100 MG capsule Take 1 capsule (100 mg total) by mouth 2 (two) times daily. 10 capsule 0   esomeprazole (NEXIUM) 20 MG capsule 1 capsule     FLOVENT HFA 220 MCG/ACT inhaler Inhale 2 puffs into the lungs 2 (two) times daily as needed.      fluticasone (FLOVENT HFA) 220 MCG/ACT inhaler 2 puffs     ibuprofen (ADVIL) 800 MG tablet Take 1 tablet (800 mg total) by mouth every 8 (eight) hours as needed. 30 tablet 0   promethazine (PHENERGAN) 12.5 MG tablet Take 1 tablet (12.5 mg total) by mouth every 8 (eight) hours as needed for nausea or vomiting. 20 tablet 0   Testosterone 100 MG PLLT 100 mg by Implant route every 3 (three) months.      No current facility-administered medications on file prior to visit.    No Known Allergies  Objective: There were no vitals filed for this visit.  General: No acute distress, AAOx3  Right foot: Incision healing well with dry scabbing and some scar contracture at the level of the second metatarsal phalangeal joint at the second toe ,mild swelling to right foot, no erythema, no warmth, no drainage, no signs of infection noted, Capillary fill time <3 seconds in all digits, gross sensation present via light touch to right foot. No pain or crepitation with range of motion right foot.  There is elevation of the second toe on nonweightbearing, subjective numbness and tingling worse at night to right foot especially at surgical site like previous.  No pain with calf compression.   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       Right foot pain          -Patient seen and evaluated -X-rays consistent with postoperative  status -Incision site check performed -Patient no longer needs dressings to the area may wear a clean white sock -Replaced surgical shoe with a flat surgical shoe this visit patient may weight-bear to tolerance with this shoe for 2 weeks and then slowly after 2 weeks may transition to a good supportive shoe like a tennis shoe -Dispensed a Darco toe alignment splint for patient to use at bedtime to help with stretching the toe and preventing so much scar contracture at the right second toe that is adding to the elevation -Advised patient after 2 weeks she may start using scar away or Mederma -Advised patient to limit activity to tolerance -Advised patient to ice and  elevate as necessary  -May continue with Motrin as needed for pain no longer needs to take it for swelling and inflammation since her swelling currently is well controlled -Will plan for post op check and xrays at next office visit. In the meantime, patient to call office if any issues or problems arise.   Landis Martins, DPM

## 2021-04-02 ENCOUNTER — Other Ambulatory Visit: Payer: Self-pay | Admitting: Medical-Surgical

## 2021-04-02 DIAGNOSIS — Z1231 Encounter for screening mammogram for malignant neoplasm of breast: Secondary | ICD-10-CM

## 2021-04-23 DIAGNOSIS — M9907 Segmental and somatic dysfunction of upper extremity: Secondary | ICD-10-CM | POA: Diagnosis not present

## 2021-04-23 DIAGNOSIS — M7542 Impingement syndrome of left shoulder: Secondary | ICD-10-CM | POA: Diagnosis not present

## 2021-04-23 DIAGNOSIS — M9902 Segmental and somatic dysfunction of thoracic region: Secondary | ICD-10-CM | POA: Diagnosis not present

## 2021-04-23 DIAGNOSIS — F0781 Postconcussional syndrome: Secondary | ICD-10-CM | POA: Diagnosis not present

## 2021-04-23 DIAGNOSIS — M9901 Segmental and somatic dysfunction of cervical region: Secondary | ICD-10-CM | POA: Diagnosis not present

## 2021-04-26 ENCOUNTER — Telehealth: Payer: BC Managed Care – PPO | Admitting: Physician Assistant

## 2021-04-26 ENCOUNTER — Encounter: Payer: BC Managed Care – PPO | Admitting: Sports Medicine

## 2021-04-26 DIAGNOSIS — U071 COVID-19: Secondary | ICD-10-CM

## 2021-04-26 MED ORDER — FLUTICASONE PROPIONATE 50 MCG/ACT NA SUSP: 2.0000 | Freq: Every day | NASAL | 0 refills | Status: DC

## 2021-04-26 MED ORDER — BENZONATATE 100 MG PO CAPS: 100.0000 mg | ORAL_CAPSULE | Freq: Three times a day (TID) | ORAL | 0 refills | Status: DC | PRN

## 2021-04-26 MED ORDER — ALBUTEROL SULFATE HFA 108 (90 BASE) MCG/ACT IN AERS
2.0000 | INHALATION_SPRAY | Freq: Four times a day (QID) | RESPIRATORY_TRACT | 0 refills | Status: DC | PRN
Start: 2021-04-26 — End: 2021-07-16

## 2021-04-26 NOTE — Progress Notes (Signed)
E-Visit for Positive Covid Test Result We are sorry you are not feeling well. We are here to help!  You have tested positive for COVID-19, meaning that you were infected with the novel coronavirus and could give the virus to others.  It is vitally important that you stay home so you do not spread it to others.      Please continue isolation at home, for at least 10 days since the start of your symptoms and until you have had 24 hours with no fever (without taking a fever reducer) and with improving of symptoms.  If you have no symptoms but tested positive (or all symptoms resolve after 5 days and you have no fever) you can leave your house but continue to wear a mask around others for an additional 5 days. If you have a fever,continue to stay home until you have had 24 hours of no fever. Most cases improve 5-10 days from onset but we have seen a small number of patients who have gotten worse after the 10 days.  Please be sure to watch for worsening symptoms and remain taking the proper precautions.   Go to the nearest hospital ED for assessment if fever/cough/breathlessness are severe or illness seems like a threat to life.    The following symptoms may appear 2-14 days after exposure: Fever Cough Shortness of breath or difficulty breathing Chills Repeated shaking with chills Muscle pain Headache Sore throat New loss of taste or smell Fatigue Congestion or runny nose Nausea or vomiting Diarrhea  You have been enrolled in MyChart Home Monitoring for COVID-19. Daily you will receive a questionnaire within the MyChart website. Our COVID-19 response team will be monitoring your responses daily.  You can use medication such as prescription cough medication called Tessalon Perles 100 mg. You may take 1-2 capsules every 8 hours as needed for cough,  prescription inhaler called Albuterol MDI 90 mcg /actuation 2 puffs every 4 hours as needed for shortness of breath, wheezing, cough, and  prescription for Fluticasone nasal spray 2 sprays in each nostril one time per day  You may also take acetaminophen (Tylenol) as needed for fever.  HOME CARE: Only take medications as instructed by your medical team. Drink plenty of fluids and get plenty of rest. A steam or ultrasonic humidifier can help if you have congestion.   GET HELP RIGHT AWAY IF YOU HAVE EMERGENCY WARNING SIGNS.  Call 911 or proceed to your closest emergency facility if: You develop worsening high fever. Trouble breathing Bluish lips or face Persistent pain or pressure in the chest New confusion Inability to wake or stay awake You cough up blood. Your symptoms become more severe Inability to hold down food or fluids  This list is not all possible symptoms. Contact your medical provider for any symptoms that are severe or concerning to you.    Your e-visit answers were reviewed by a board certified advanced clinical practitioner to complete your personal care plan.  Depending on the condition, your plan could have included both over the counter or prescription medications.  If there is a problem please reply once you have received a response from your provider.  Your safety is important to us.  If you have drug allergies check your prescription carefully.    You can use MyChart to ask questions about today's visit, request a non-urgent call back, or ask for a work or school excuse for 24 hours related to this e-Visit. If it has been greater than 24   hours you will need to follow up with your provider, or enter a new e-Visit to address those concerns. You will get an e-mail in the next two days asking about your experience.  I hope that your e-visit has been valuable and will speed your recovery. Thank you for using e-visits.   I provided 5 minutes of non face-to-face time during this encounter for chart review and documentation.   

## 2021-05-03 ENCOUNTER — Ambulatory Visit: Payer: BC Managed Care – PPO

## 2021-05-18 ENCOUNTER — Other Ambulatory Visit: Payer: Self-pay | Admitting: Physician Assistant

## 2021-05-18 DIAGNOSIS — U071 COVID-19: Secondary | ICD-10-CM

## 2021-05-21 DIAGNOSIS — F0781 Postconcussional syndrome: Secondary | ICD-10-CM | POA: Diagnosis not present

## 2021-05-21 DIAGNOSIS — M9902 Segmental and somatic dysfunction of thoracic region: Secondary | ICD-10-CM | POA: Diagnosis not present

## 2021-05-21 DIAGNOSIS — M9901 Segmental and somatic dysfunction of cervical region: Secondary | ICD-10-CM | POA: Diagnosis not present

## 2021-05-21 DIAGNOSIS — M9907 Segmental and somatic dysfunction of upper extremity: Secondary | ICD-10-CM | POA: Diagnosis not present

## 2021-05-21 DIAGNOSIS — M7542 Impingement syndrome of left shoulder: Secondary | ICD-10-CM | POA: Diagnosis not present

## 2021-05-23 ENCOUNTER — Other Ambulatory Visit: Payer: Self-pay

## 2021-05-23 ENCOUNTER — Ambulatory Visit (INDEPENDENT_AMBULATORY_CARE_PROVIDER_SITE_OTHER): Payer: BC Managed Care – PPO

## 2021-05-23 DIAGNOSIS — Z1231 Encounter for screening mammogram for malignant neoplasm of breast: Secondary | ICD-10-CM

## 2021-05-24 ENCOUNTER — Ambulatory Visit (INDEPENDENT_AMBULATORY_CARE_PROVIDER_SITE_OTHER): Payer: BC Managed Care – PPO

## 2021-05-24 ENCOUNTER — Ambulatory Visit (INDEPENDENT_AMBULATORY_CARE_PROVIDER_SITE_OTHER): Payer: BC Managed Care – PPO | Admitting: Sports Medicine

## 2021-05-24 ENCOUNTER — Encounter: Payer: Self-pay | Admitting: Sports Medicine

## 2021-05-24 DIAGNOSIS — Z9889 Other specified postprocedural states: Secondary | ICD-10-CM

## 2021-05-24 DIAGNOSIS — M2041 Other hammer toe(s) (acquired), right foot: Secondary | ICD-10-CM | POA: Diagnosis not present

## 2021-05-24 DIAGNOSIS — M779 Enthesopathy, unspecified: Secondary | ICD-10-CM

## 2021-05-24 DIAGNOSIS — L905 Scar conditions and fibrosis of skin: Secondary | ICD-10-CM

## 2021-05-24 DIAGNOSIS — M7741 Metatarsalgia, right foot: Secondary | ICD-10-CM

## 2021-05-24 DIAGNOSIS — M25871 Other specified joint disorders, right ankle and foot: Secondary | ICD-10-CM

## 2021-05-24 DIAGNOSIS — M79671 Pain in right foot: Secondary | ICD-10-CM

## 2021-05-24 NOTE — Progress Notes (Signed)
Subjective: Rebecca Morrow is a 50 y.o. female patient seen today in office for POV #4 (DOS 02/26/21), S/P right 2nd hammertoe with 2nd MTPJ ligament repair and met osteotomy. Patient denies pain at surgical site except some occasional pain and swelling, denies calf pain, denies headache, chest pain, shortness of breath, vomiting, fever, or chills.  No other issues noted.   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 8 g 0   benzonatate (TESSALON) 100 MG capsule Take 1 capsule (100 mg total) by mouth 3 (three) times daily as needed. 30 capsule 0   budesonide (PULMICORT) 0.5 MG/2ML nebulizer solution 2 ml     diclofenac (VOLTAREN) 75 MG EC tablet Take 1 tablet (75 mg total) by mouth 2 (two) times daily. 30 tablet 0   docusate sodium (COLACE) 100 MG capsule Take 1 capsule (100 mg total) by mouth 2 (two) times daily. 10 capsule 0   esomeprazole (NEXIUM) 20 MG capsule 1 capsule     FLOVENT HFA 220 MCG/ACT inhaler Inhale 2 puffs into the lungs 2 (two) times daily as needed.      fluticasone (FLONASE) 50 MCG/ACT nasal spray Place 2 sprays into both nostrils daily. 16 g 0   fluticasone (FLOVENT HFA) 220 MCG/ACT inhaler 2 puffs     ibuprofen (ADVIL) 800 MG tablet  Take 1 tablet (800 mg total) by mouth every 8 (eight) hours as needed. 30 tablet 0   promethazine (PHENERGAN) 12.5 MG tablet Take 1 tablet (12.5 mg total) by mouth every 8 (eight) hours as needed for nausea or vomiting. 20 tablet 0   Testosterone 100 MG PLLT 100 mg by Implant route every 3 (three) months.     No current facility-administered medications on file prior to visit.    No Known Allergies  Objective: There were no vitals filed for this visit.  General: No acute distress, AAOx3  Right foot: Incision well healed with some scar contracture at the level of the second metatarsal phalangeal joint at the second toe, that's improving ,mild swelling to right foot, no erythema, no warmth, no drainage, no signs of infection noted, Capillary fill time <3 seconds in all digits, gross sensation present via light touch to right foot. No pain or crepitation with range of motion right foot.  There is elevation of the second toe on nonweightbearing that is improving, now very slight.  No pain with calf compression.   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

## 2021-07-05 ENCOUNTER — Encounter: Payer: BC Managed Care – PPO | Admitting: Sports Medicine

## 2021-07-06 ENCOUNTER — Encounter: Payer: BC Managed Care – PPO | Admitting: Medical-Surgical

## 2021-07-09 DIAGNOSIS — M9901 Segmental and somatic dysfunction of cervical region: Secondary | ICD-10-CM | POA: Diagnosis not present

## 2021-07-09 DIAGNOSIS — M9902 Segmental and somatic dysfunction of thoracic region: Secondary | ICD-10-CM | POA: Diagnosis not present

## 2021-07-09 DIAGNOSIS — M7542 Impingement syndrome of left shoulder: Secondary | ICD-10-CM | POA: Diagnosis not present

## 2021-07-09 DIAGNOSIS — M9907 Segmental and somatic dysfunction of upper extremity: Secondary | ICD-10-CM | POA: Diagnosis not present

## 2021-07-16 ENCOUNTER — Other Ambulatory Visit: Payer: Self-pay

## 2021-07-16 ENCOUNTER — Ambulatory Visit (INDEPENDENT_AMBULATORY_CARE_PROVIDER_SITE_OTHER): Payer: BC Managed Care – PPO | Admitting: Medical-Surgical

## 2021-07-16 ENCOUNTER — Encounter: Payer: Self-pay | Admitting: Medical-Surgical

## 2021-07-16 VITALS — BP 128/76 | HR 62 | Resp 20 | Ht 67.5 in | Wt 178.0 lb

## 2021-07-16 DIAGNOSIS — Z0000 Encounter for general adult medical examination without abnormal findings: Secondary | ICD-10-CM

## 2021-07-16 DIAGNOSIS — Z23 Encounter for immunization: Secondary | ICD-10-CM | POA: Diagnosis not present

## 2021-07-16 NOTE — Progress Notes (Signed)
HPI: Rebecca Morrow is a 50 y.o. female who  has a past medical history of Arthritis, Ganglion cyst of wrist, Lateral epicondylitis of right elbow (01/19/2019), Left knee DJD, and Shoulder pain with history of repair of rotator cuff and biceps tenodesis on 10-09-2018 (01/19/2019).  she presents to [facility] today, 07/16/21,  for chief complaint of: Annual physical exam  Dentist: Up-to-date, every 6 months, no concerns Eye exam: Up-to-date, wears glasses to drive Exercise: Walking and doing weights but has been limited by her recent toe surgery Diet: No significant restrictions Pap smear: 2018, normal Mammogram: Up-to-date Colon cancer screening: Unsure COVID vaccine: Done, 1 booster completed  Concerns: None  Past medical, surgical, social and family history reviewed:  Patient Active Problem List   Diagnosis Date Noted   Abdominal pain 03/15/2021   Constipation 03/15/2021   Dysphagia 03/15/2021   Gastroesophageal reflux disease 03/15/2021   Personal history of colonic polyps 81/27/5170   Eosinophilic esophagitis 01/74/9449   Intolerance, food 12/02/2019   Left Shoulder pain with history of repair of rotator cuff and biceps tenodesis on 10-09-2018 01/19/2019   Ganglion cyst of dorsum of left wrist 03/19/2018   Routine general medical examination at a health care facility 10/12/2015   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    Past Surgical History:  Procedure Laterality Date   GANGLION CYST EXCISION  08/27/2012   Procedure: REMOVAL GANGLION OF WRIST;  Surgeon: Cammie Sickle., MD;  Location: [place];  Service: Orthopedics;  Laterality: Left;  EXCISION BIOPSY LEFT WRIST DORSAL GANGLION CYST   JOINT REPLACEMENT     7  knee procedures   KNEE ARTHROSCOPY     LT knee sx x6   SHOULDER CLOSED REDUCTION Left 01/25/2019   Procedure: CLOSED MANIPULATION  LEFT SHOULDER;  Surgeon: Elsie Saas, MD;  Location: [place];   Service: Orthopedics;  Laterality: Left;  left   STERIOD INJECTION Right 01/25/2019   Procedure: CORTIZONE  INJECTION IN RIGHT ELBOW;  Surgeon: Elsie Saas, MD;  Location: [place];  Service: Orthopedics;  Laterality: Right;  right   TOTAL KNEE ARTHROPLASTY Left 03/29/2013   Dr Telford Nab   TOTAL KNEE ARTHROPLASTY Left 03/29/2013   Procedure: TOTAL KNEE ARTHROPLASTY;  Surgeon: Lorn Junes, MD;  Location: [place];  Service: Orthopedics;  Laterality: Left;    Social History   Tobacco Use   Smoking status: Never   Smokeless tobacco: Never  Substance Use Topics   Alcohol use: Yes    Alcohol/week: 3.0 standard drinks    Types: 3 Cans of beer per week    Family History  Problem Relation Age of Onset   Heart attack Father 59   Hypertension Father    Diabetes Mellitus II Mother    Arthritis Mother    Hypertension Maternal Grandmother    Colon cancer Paternal Grandmother    Allergic rhinitis Neg Hx    Angioedema Neg Hx    Asthma Neg Hx    Eczema Neg Hx    Immunodeficiency Neg Hx    Urticaria Neg Hx      Current medication list and allergy/intolerance information reviewed:    Current Outpatient Medications  Medication Sig Dispense Refill   esomeprazole (NEXIUM) 20 MG capsule 1 capsule     No current facility-administered medications for this visit.    No Known Allergies    Review of Systems: Constitutional:  No  fever, no chills, No recent illness, No unintentional  weight changes. No significant fatigue.  HEENT: No  headache, no vision change, no hearing change, No sore throat, No  sinus pressure Cardiac: No  chest pain, No  pressure, No palpitations, No  Orthopnea Respiratory:  No  shortness of breath. No  Cough Gastrointestinal: No  abdominal pain, No  nausea, No  vomiting,  No  blood in stool, No  diarrhea, No  constipation  Musculoskeletal: No new myalgia/arthralgia Skin: No  Rash, No other wounds/concerning lesions Genitourinary: No  incontinence, No   abnormal genital bleeding, No abnormal genital discharge Hem/Onc: No  easy bruising/bleeding, No  abnormal lymph node Endocrine: No cold intolerance,  No heat intolerance. No polyuria/polydipsia/polyphagia  Neurologic: No  weakness, No  dizziness, No  slurred speech/focal weakness/facial droop Psychiatric: No  concerns with depression, No  concerns with anxiety, No sleep problems, No mood problems  Exam:  BP 128/76 (BP Location: Left Arm, Patient Position: Sitting, Cuff Size: Normal)   Pulse 62   Resp 20   Ht 5' 7.5" (1.715 m)   Wt 178 lb (80.7 kg)   SpO2 98%   BMI 27.47 kg/m  Constitutional: VS see above. General Appearance: alert, well-developed, well-nourished, NAD Eyes: Normal lids and conjunctive, non-icteric sclera Ears, Nose, Mouth, Throat: MMM, Normal external inspection ears/nares/mouth/lips/gums. TM normal bilaterally. .  Neck: No masses, trachea midline. No thyroid enlargement. No tenderness/mass appreciated. No lymphadenopathy Respiratory: Normal respiratory effort. no wheeze, no rhonchi, no rales Cardiovascular: S1/S2 normal, no murmur, no rub/gallop auscultated. RRR. No lower extremity edema. Pedal pulse II/IV bilaterally DP and PT. No carotid bruit or JVD. No abdominal aortic bruit. Gastrointestinal: Nontender, no masses. No hepatomegaly, no splenomegaly. No hernia appreciated. Bowel sounds normal. Rectal exam deferred.  Musculoskeletal: Gait normal. No clubbing/cyanosis of digits.  Neurological: Normal balance/coordination. No tremor. No cranial nerve deficit on limited exam. Motor and sensation intact and symmetric. Cerebellar reflexes intact.  Skin: warm, dry, intact. No rash/ulcer. No concerning nevi or subq nodules on limited exam.   Psychiatric: Normal judgment/insight. Normal mood and affect. Oriented x3.    ASSESSMENT/PLAN:   1. Annual physical exam Checking CBC with differential, CMP, and lipid panel today.  Wellness information provided with AVS.  Up-to-date on  preventative care. - CBC with Differential/Platelet - COMPLETE METABOLIC PANEL WITH GFR - Lipid panel  2. Flu vaccine need Flu vaccine given in office today. - Flu Vaccine QUAD 62moIM (Fluarix, Fluzone & Alfiuria Quad PF)  Orders Placed This Encounter  Procedures   Flu Vaccine QUAD 669moM (Fluarix, Fluzone & Alfiuria Quad PF)   CBC with Differential/Platelet   COMPLETE METABOLIC PANEL WITH GFR   Lipid panel    No orders of the defined types were placed in this encounter.   Patient Instructions  Pristiq Effexor  Preventive Care [id number] Old, Female Preventive care refers to lifestyle choices and visits with your health care provider that can promote health and wellness. This includes: A yearly physical exam. This is also called an annual wellness visit. Regular dental and eye exams. Immunizations. Screening for certain conditions. Healthy lifestyle choices, such as: Eating a healthy diet. Getting regular exercise. Not using drugs or products that contain nicotine and tobacco. Limiting alcohol use. What can I expect for my preventive care visit? Physical exam Your health care provider will check your: Height and weight. These may be used to calculate your BMI (body mass index). BMI is a measurement that tells if you are at a healthy weight. Heart rate and blood pressure. Body temperature.  Skin for abnormal spots. Counseling Your health care provider may ask you questions about your: Past medical problems. Family's medical history. Alcohol, tobacco, and drug use. Emotional well-being. Home life and relationship well-being. Sexual activity. Diet, exercise, and sleep habits. Work and work Statistician. Access to firearms. Method of birth control. Menstrual cycle. Pregnancy history. What immunizations do I need? Vaccines are usually given at various ages, according to a schedule. Your health care provider will recommend vaccines for you based on your age, medical  history, and lifestyle or other factors, such as travel or where you work. What tests do I need? Blood tests Lipid and cholesterol levels. These may be checked every 5 years, or more often if you are over 58 years old. Hepatitis C test. Hepatitis B test. Screening Lung cancer screening. You may have this screening every year starting at age 39 if you have a 30-pack-year history of smoking and currently smoke or have quit within the past 15 years. Colorectal cancer screening. All adults should have this screening starting at age 52 and continuing until age 75. Your health care provider may recommend screening at age 86 if you are at increased risk. You will have tests every 1-10 years, depending on your results and the type of screening test. Diabetes screening. This is done by checking your blood sugar (glucose) after you have not eaten for a while (fasting). You may have this done every 1-3 years. Mammogram. This may be done every 1-2 years. Talk with your health care provider about when you should start having regular mammograms. This may depend on whether you have a family history of breast cancer. BRCA-related cancer screening. This may be done if you have a family history of breast, ovarian, tubal, or peritoneal cancers. Pelvic exam and Pap test. This may be done every 3 years starting at age 70. Starting at age 13, this may be done every 5 years if you have a Pap test in combination with an HPV test. Other tests STD (sexually transmitted disease) testing, if you are at risk. Bone density scan. This is done to screen for osteoporosis. You may have this scan if you are at high risk for osteoporosis. Talk with your health care provider about your test results, treatment options, and if necessary, the need for more tests. Follow these instructions at home: Eating and drinking  Eat a diet that includes fresh fruits and vegetables, whole grains, lean protein, and low-fat dairy  products. Take vitamin and mineral supplements as recommended by your health care provider. Do not drink alcohol if: Your health care provider tells you not to drink. You are pregnant, may be pregnant, or are planning to become pregnant. If you drink alcohol: Limit how much you have to 0-1 drink a day. Be aware of how much alcohol is in your drink. In the [place] one drink equals one 12 oz bottle of beer (355 mL), one 5 oz glass of wine (148 mL), or one 1 oz glass of hard liquor (44 mL). Lifestyle Take daily care of your teeth and gums. Brush your teeth every morning and night with fluoride toothpaste. Floss one time each day. Stay active. Exercise for at least 30 minutes 5 or more days each week. Do not use any products that contain nicotine or tobacco, such as cigarettes, e-cigarettes, and chewing tobacco. If you need help quitting, ask your health care provider. Do not use drugs. If you are sexually active, practice safe sex. Use a condom or other form of  protection to prevent STIs (sexually transmitted infections). If you do not wish to become pregnant, use a form of birth control. If you plan to become pregnant, see your health care provider for a prepregnancy visit. If told by your health care provider, take low-dose aspirin daily starting at age 45. Find healthy ways to cope with stress, such as: Meditation, yoga, or listening to music. Journaling. Talking to a trusted person. Spending time with friends and family. Safety Always wear your seat belt while driving or riding in a vehicle. Do not drive: If you have been drinking alcohol. Do not ride with someone who has been drinking. When you are tired or distracted. While texting. Wear a helmet and other protective equipment during sports activities. If you have firearms in your house, make sure you follow all gun safety procedures. What's next? Visit your health care provider once a year for an annual wellness visit. Ask your  health care provider how often you should have your eyes and teeth checked. Stay up to date on all vaccines. This information is not intended to replace advice given to you by your health care provider. Make sure you discuss any questions you have with your health care provider. Document Revised: 12/22/2020 Document Reviewed: 06/25/2018 Elsevier Patient Education  2022 [employer].  Follow-up plan: Return in about 1 year (around 07/16/2022) for annual physical exam.  Clearnce Sorrel, DNP, APRN, FNP-BC [facility]

## 2021-07-16 NOTE — Patient Instructions (Signed)
Pristiq Effexor  Preventive Care 52-52 Years Old, Female Preventive care refers to lifestyle choices and visits with your health care provider that can promote health and wellness. This includes: A yearly physical exam. This is also called an annual wellness visit. Regular dental and eye exams. Immunizations. Screening for certain conditions. Healthy lifestyle choices, such as: Eating a healthy diet. Getting regular exercise. Not using drugs or products that contain nicotine and tobacco. Limiting alcohol use. What can I expect for my preventive care visit? Physical exam Your health care provider will check your: Height and weight. These may be used to calculate your BMI (body mass index). BMI is a measurement that tells if you are at a healthy weight. Heart rate and blood pressure. Body temperature. Skin for abnormal spots. Counseling Your health care provider may ask you questions about your: Past medical problems. Family's medical history. Alcohol, tobacco, and drug use. Emotional well-being. Home life and relationship well-being. Sexual activity. Diet, exercise, and sleep habits. Work and work Statistician. Access to firearms. Method of birth control. Menstrual cycle. Pregnancy history. What immunizations do I need? Vaccines are usually given at various ages, according to a schedule. Your health care provider will recommend vaccines for you based on your age, medical history, and lifestyle or other factors, such as travel or where you work. What tests do I need? Blood tests Lipid and cholesterol levels. These may be checked every 5 years, or more often if you are over 40 years old. Hepatitis C test. Hepatitis B test. Screening Lung cancer screening. You may have this screening every year starting at age 75 if you have a 30-pack-year history of smoking and currently smoke or have quit within the past 15 years. Colorectal cancer screening. All adults should have this  screening starting at age 63 and continuing until age 73. Your health care provider may recommend screening at age 55 if you are at increased risk. You will have tests every 1-10 years, depending on your results and the type of screening test. Diabetes screening. This is done by checking your blood sugar (glucose) after you have not eaten for a while (fasting). You may have this done every 1-3 years. Mammogram. This may be done every 1-2 years. Talk with your health care provider about when you should start having regular mammograms. This may depend on whether you have a family history of breast cancer. BRCA-related cancer screening. This may be done if you have a family history of breast, ovarian, tubal, or peritoneal cancers. Pelvic exam and Pap test. This may be done every 3 years starting at age 68. Starting at age 28, this may be done every 5 years if you have a Pap test in combination with an HPV test. Other tests STD (sexually transmitted disease) testing, if you are at risk. Bone density scan. This is done to screen for osteoporosis. You may have this scan if you are at high risk for osteoporosis. Talk with your health care provider about your test results, treatment options, and if necessary, the need for more tests. Follow these instructions at home: Eating and drinking  Eat a diet that includes fresh fruits and vegetables, whole grains, lean protein, and low-fat dairy products. Take vitamin and mineral supplements as recommended by your health care provider. Do not drink alcohol if: Your health care provider tells you not to drink. You are pregnant, may be pregnant, or are planning to become pregnant. If you drink alcohol: Limit how much you have to 0-1 drink  a day. Be aware of how much alcohol is in your drink. In the [place] one drink equals one 12 oz bottle of beer (355 mL), one 5 oz glass of wine (148 mL), or one 1 oz glass of hard liquor (44 mL). Lifestyle Take daily care  of your teeth and gums. Brush your teeth every morning and night with fluoride toothpaste. Floss one time each day. Stay active. Exercise for at least 30 minutes 5 or more days each week. Do not use any products that contain nicotine or tobacco, such as cigarettes, e-cigarettes, and chewing tobacco. If you need help quitting, ask your health care provider. Do not use drugs. If you are sexually active, practice safe sex. Use a condom or other form of protection to prevent STIs (sexually transmitted infections). If you do not wish to become pregnant, use a form of birth control. If you plan to become pregnant, see your health care provider for a prepregnancy visit. If told by your health care provider, take low-dose aspirin daily starting at age 37. Find healthy ways to cope with stress, such as: Meditation, yoga, or listening to music. Journaling. Talking to a trusted person. Spending time with friends and family. Safety Always wear your seat belt while driving or riding in a vehicle. Do not drive: If you have been drinking alcohol. Do not ride with someone who has been drinking. When you are tired or distracted. While texting. Wear a helmet and other protective equipment during sports activities. If you have firearms in your house, make sure you follow all gun safety procedures. What's next? Visit your health care provider once a year for an annual wellness visit. Ask your health care provider how often you should have your eyes and teeth checked. Stay up to date on all vaccines. This information is not intended to replace advice given to you by your health care provider. Make sure you discuss any questions you have with your health care provider. Document Revised: 12/22/2020 Document Reviewed: 06/25/2018 Elsevier Patient Education  2022 [employer].

## 2021-07-19 ENCOUNTER — Other Ambulatory Visit: Payer: Self-pay

## 2021-07-19 ENCOUNTER — Encounter: Payer: Self-pay | Admitting: Sports Medicine

## 2021-07-19 ENCOUNTER — Ambulatory Visit (INDEPENDENT_AMBULATORY_CARE_PROVIDER_SITE_OTHER): Payer: BC Managed Care – PPO | Admitting: Sports Medicine

## 2021-07-19 DIAGNOSIS — M7741 Metatarsalgia, right foot: Secondary | ICD-10-CM

## 2021-07-19 DIAGNOSIS — Z9889 Other specified postprocedural states: Secondary | ICD-10-CM

## 2021-07-19 DIAGNOSIS — M2041 Other hammer toe(s) (acquired), right foot: Secondary | ICD-10-CM

## 2021-07-19 DIAGNOSIS — Z0000 Encounter for general adult medical examination without abnormal findings: Secondary | ICD-10-CM | POA: Diagnosis not present

## 2021-07-19 DIAGNOSIS — L905 Scar conditions and fibrosis of skin: Secondary | ICD-10-CM

## 2021-07-19 NOTE — Progress Notes (Signed)
  Subjective: Rebecca Morrow is a 50 y.o. female patient seen today in office for POV #5 (DOS 02/26/21), S/P right 2nd hammertoe with 2nd MTPJ ligament repair and met osteotomy. Patient reports that she is doing better admits a little swelling, denies calf pain, denies headache, chest pain, shortness of breath, vomiting, fever, or chills.  No other issues noted.   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    No Known Allergies  Objective: There were no vitals filed for this visit.  General: No acute distress, AAOx3  Right foot: Incision well healed with decreased scar contracture at the level of the second metatarsal phalangeal joint at the second toe, that's improving ,mild swelling to right foot, no erythema, no warmth, no drainage, no signs of infection noted, Capillary fill time <3 seconds in all digits, gross sensation present via light touch to right foot. No pain or crepitation with range of motion right foot.  There is elevation of the second toe on nonweightbearing that is improving, now very slight.  No pain with calf compression.   Assessment and Plan:  Problem List Items Addressed This Visit   None Visit Diagnoses     Hammertoe of right foot    -  Primary   Status  post surgery       Scar contracture       Metatarsalgia, right foot          -Patient seen and evaluated -Encouraged scar cream to tolerance -Continue with darco toe alignment splint to tolerance -Advised patient may slowly increase activities to tolerance -Advised patient to ice and elevate as needed -Advised patient to use tyleonol or motrin as needed -Return as needed or sooner if problems or issues arise.  Landis Martins, DPM

## 2021-07-20 LAB — CBC WITH DIFFERENTIAL/PLATELET
Absolute Monocytes: 372 cells/uL (ref 200–950)
Basophils Absolute: 31 cells/uL (ref 0–200)
Basophils Relative: 0.6 %
Eosinophils Absolute: 250 cells/uL (ref 15–500)
Eosinophils Relative: 4.9 %
HCT: 43.1 % (ref 35.0–45.0)
Hemoglobin: 14.1 g/dL (ref 11.7–15.5)
Lymphs Abs: 1974 cells/uL (ref 850–3900)
MCH: 29.9 pg (ref 27.0–33.0)
MCHC: 32.7 g/dL (ref 32.0–36.0)
MCV: 91.3 fL (ref 80.0–100.0)
MPV: 11.1 fL (ref 7.5–12.5)
Monocytes Relative: 7.3 %
Neutro Abs: 2474 cells/uL (ref 1500–7800)
Neutrophils Relative %: 48.5 %
Platelets: 268 10*3/uL (ref 140–400)
RBC: 4.72 10*6/uL (ref 3.80–5.10)
RDW: 12.3 % (ref 11.0–15.0)
Total Lymphocyte: 38.7 %
WBC: 5.1 10*3/uL (ref 3.8–10.8)

## 2021-07-20 LAB — LIPID PANEL
Cholesterol: 161 mg/dL (ref <200)
HDL: 56 mg/dL (ref >=50)
LDL Cholesterol (Calc): 87 mg/dL (calc)
Non-HDL Cholesterol (Calc): 105 mg/dL (calc) (ref <130)
Total CHOL/HDL Ratio: 2.9 (calc) (ref <5.0)
Triglycerides: 85 mg/dL (ref <150)

## 2021-07-20 LAB — COMPLETE METABOLIC PANEL WITH GFR
AG Ratio: 1.8 (calc) (ref 1.0–2.5)
ALT: 11 U/L (ref 6–29)
AST: 17 U/L (ref 10–35)
Albumin: 4.4 g/dL (ref 3.6–5.1)
Alkaline phosphatase (APISO): 60 U/L (ref 37–153)
BUN: 18 mg/dL (ref 7–25)
CO2: 27 mmol/L (ref 20–32)
Calcium: 9.5 mg/dL (ref 8.6–10.4)
Chloride: 107 mmol/L (ref 98–110)
Creat: 0.84 mg/dL (ref 0.50–1.03)
Globulin: 2.5 g/dL (calc) (ref 1.9–3.7)
Glucose, Bld: 97 mg/dL (ref 65–99)
Potassium: 4.8 mmol/L (ref 3.5–5.3)
Sodium: 141 mmol/L (ref 135–146)
Total Bilirubin: 0.5 mg/dL (ref 0.2–1.2)
Total Protein: 6.9 g/dL (ref 6.1–8.1)
eGFR: 84 mL/min/{1.73_m2} (ref >=60)

## 2021-07-31 DIAGNOSIS — M25561 Pain in right knee: Secondary | ICD-10-CM | POA: Diagnosis not present

## 2021-08-02 DIAGNOSIS — M6281 Muscle weakness (generalized): Secondary | ICD-10-CM | POA: Diagnosis not present

## 2021-08-02 DIAGNOSIS — M23361 Other meniscus derangements, other lateral meniscus, right knee: Secondary | ICD-10-CM | POA: Diagnosis not present

## 2021-08-02 DIAGNOSIS — M25561 Pain in right knee: Secondary | ICD-10-CM | POA: Diagnosis not present

## 2021-08-02 DIAGNOSIS — R262 Difficulty in walking, not elsewhere classified: Secondary | ICD-10-CM | POA: Diagnosis not present

## 2021-08-03 DIAGNOSIS — R262 Difficulty in walking, not elsewhere classified: Secondary | ICD-10-CM | POA: Diagnosis not present

## 2021-08-03 DIAGNOSIS — M6281 Muscle weakness (generalized): Secondary | ICD-10-CM | POA: Diagnosis not present

## 2021-08-03 DIAGNOSIS — M23361 Other meniscus derangements, other lateral meniscus, right knee: Secondary | ICD-10-CM | POA: Diagnosis not present

## 2021-08-03 DIAGNOSIS — M25561 Pain in right knee: Secondary | ICD-10-CM | POA: Diagnosis not present

## 2021-08-13 DIAGNOSIS — M7542 Impingement syndrome of left shoulder: Secondary | ICD-10-CM | POA: Diagnosis not present

## 2021-08-13 DIAGNOSIS — M9907 Segmental and somatic dysfunction of upper extremity: Secondary | ICD-10-CM | POA: Diagnosis not present

## 2021-08-13 DIAGNOSIS — M25561 Pain in right knee: Secondary | ICD-10-CM | POA: Diagnosis not present

## 2021-08-13 DIAGNOSIS — R262 Difficulty in walking, not elsewhere classified: Secondary | ICD-10-CM | POA: Diagnosis not present

## 2021-08-13 DIAGNOSIS — M6281 Muscle weakness (generalized): Secondary | ICD-10-CM | POA: Diagnosis not present

## 2021-08-13 DIAGNOSIS — M9901 Segmental and somatic dysfunction of cervical region: Secondary | ICD-10-CM | POA: Diagnosis not present

## 2021-08-13 DIAGNOSIS — M23361 Other meniscus derangements, other lateral meniscus, right knee: Secondary | ICD-10-CM | POA: Diagnosis not present

## 2021-08-13 DIAGNOSIS — M9902 Segmental and somatic dysfunction of thoracic region: Secondary | ICD-10-CM | POA: Diagnosis not present

## 2021-08-17 DIAGNOSIS — M6281 Muscle weakness (generalized): Secondary | ICD-10-CM | POA: Diagnosis not present

## 2021-08-17 DIAGNOSIS — R262 Difficulty in walking, not elsewhere classified: Secondary | ICD-10-CM | POA: Diagnosis not present

## 2021-08-17 DIAGNOSIS — M25561 Pain in right knee: Secondary | ICD-10-CM | POA: Diagnosis not present

## 2021-08-17 DIAGNOSIS — M23361 Other meniscus derangements, other lateral meniscus, right knee: Secondary | ICD-10-CM | POA: Diagnosis not present

## 2021-08-20 DIAGNOSIS — M25561 Pain in right knee: Secondary | ICD-10-CM | POA: Diagnosis not present

## 2021-08-20 DIAGNOSIS — M23361 Other meniscus derangements, other lateral meniscus, right knee: Secondary | ICD-10-CM | POA: Diagnosis not present

## 2021-08-20 DIAGNOSIS — R262 Difficulty in walking, not elsewhere classified: Secondary | ICD-10-CM | POA: Diagnosis not present

## 2021-08-20 DIAGNOSIS — M6281 Muscle weakness (generalized): Secondary | ICD-10-CM | POA: Diagnosis not present

## 2021-08-27 DIAGNOSIS — R262 Difficulty in walking, not elsewhere classified: Secondary | ICD-10-CM | POA: Diagnosis not present

## 2021-08-27 DIAGNOSIS — M23361 Other meniscus derangements, other lateral meniscus, right knee: Secondary | ICD-10-CM | POA: Diagnosis not present

## 2021-08-27 DIAGNOSIS — M25561 Pain in right knee: Secondary | ICD-10-CM | POA: Diagnosis not present

## 2021-08-27 DIAGNOSIS — M6281 Muscle weakness (generalized): Secondary | ICD-10-CM | POA: Diagnosis not present

## 2021-08-31 DIAGNOSIS — M6281 Muscle weakness (generalized): Secondary | ICD-10-CM | POA: Diagnosis not present

## 2021-08-31 DIAGNOSIS — M25561 Pain in right knee: Secondary | ICD-10-CM | POA: Diagnosis not present

## 2021-08-31 DIAGNOSIS — R262 Difficulty in walking, not elsewhere classified: Secondary | ICD-10-CM | POA: Diagnosis not present

## 2021-08-31 DIAGNOSIS — M23361 Other meniscus derangements, other lateral meniscus, right knee: Secondary | ICD-10-CM | POA: Diagnosis not present

## 2021-09-04 DIAGNOSIS — M23361 Other meniscus derangements, other lateral meniscus, right knee: Secondary | ICD-10-CM | POA: Diagnosis not present

## 2021-09-04 DIAGNOSIS — M25561 Pain in right knee: Secondary | ICD-10-CM | POA: Diagnosis not present

## 2021-09-04 DIAGNOSIS — M6281 Muscle weakness (generalized): Secondary | ICD-10-CM | POA: Diagnosis not present

## 2021-09-04 DIAGNOSIS — R262 Difficulty in walking, not elsewhere classified: Secondary | ICD-10-CM | POA: Diagnosis not present

## 2021-09-07 DIAGNOSIS — M25561 Pain in right knee: Secondary | ICD-10-CM | POA: Diagnosis not present

## 2021-09-07 DIAGNOSIS — R262 Difficulty in walking, not elsewhere classified: Secondary | ICD-10-CM | POA: Diagnosis not present

## 2021-09-07 DIAGNOSIS — M6281 Muscle weakness (generalized): Secondary | ICD-10-CM | POA: Diagnosis not present

## 2021-09-07 DIAGNOSIS — M23361 Other meniscus derangements, other lateral meniscus, right knee: Secondary | ICD-10-CM | POA: Diagnosis not present

## 2021-09-10 DIAGNOSIS — M23361 Other meniscus derangements, other lateral meniscus, right knee: Secondary | ICD-10-CM | POA: Diagnosis not present

## 2021-09-10 DIAGNOSIS — R262 Difficulty in walking, not elsewhere classified: Secondary | ICD-10-CM | POA: Diagnosis not present

## 2021-09-10 DIAGNOSIS — M25561 Pain in right knee: Secondary | ICD-10-CM | POA: Diagnosis not present

## 2021-09-10 DIAGNOSIS — M6281 Muscle weakness (generalized): Secondary | ICD-10-CM | POA: Diagnosis not present

## 2021-09-16 DIAGNOSIS — M23361 Other meniscus derangements, other lateral meniscus, right knee: Secondary | ICD-10-CM | POA: Diagnosis not present

## 2021-09-16 DIAGNOSIS — R262 Difficulty in walking, not elsewhere classified: Secondary | ICD-10-CM | POA: Diagnosis not present

## 2021-09-16 DIAGNOSIS — M6281 Muscle weakness (generalized): Secondary | ICD-10-CM | POA: Diagnosis not present

## 2021-09-16 DIAGNOSIS — M25561 Pain in right knee: Secondary | ICD-10-CM | POA: Diagnosis not present

## 2021-09-19 DIAGNOSIS — M25561 Pain in right knee: Secondary | ICD-10-CM | POA: Diagnosis not present

## 2021-09-19 DIAGNOSIS — R262 Difficulty in walking, not elsewhere classified: Secondary | ICD-10-CM | POA: Diagnosis not present

## 2021-09-19 DIAGNOSIS — M23361 Other meniscus derangements, other lateral meniscus, right knee: Secondary | ICD-10-CM | POA: Diagnosis not present

## 2021-09-19 DIAGNOSIS — M6281 Muscle weakness (generalized): Secondary | ICD-10-CM | POA: Diagnosis not present

## 2021-09-25 DIAGNOSIS — R262 Difficulty in walking, not elsewhere classified: Secondary | ICD-10-CM | POA: Diagnosis not present

## 2021-09-25 DIAGNOSIS — M6281 Muscle weakness (generalized): Secondary | ICD-10-CM | POA: Diagnosis not present

## 2021-09-25 DIAGNOSIS — M23361 Other meniscus derangements, other lateral meniscus, right knee: Secondary | ICD-10-CM | POA: Diagnosis not present

## 2021-09-25 DIAGNOSIS — M25561 Pain in right knee: Secondary | ICD-10-CM | POA: Diagnosis not present

## 2021-10-01 DIAGNOSIS — M7542 Impingement syndrome of left shoulder: Secondary | ICD-10-CM | POA: Diagnosis not present

## 2021-10-01 DIAGNOSIS — M9902 Segmental and somatic dysfunction of thoracic region: Secondary | ICD-10-CM | POA: Diagnosis not present

## 2021-10-01 DIAGNOSIS — M9907 Segmental and somatic dysfunction of upper extremity: Secondary | ICD-10-CM | POA: Diagnosis not present

## 2021-10-01 DIAGNOSIS — M9901 Segmental and somatic dysfunction of cervical region: Secondary | ICD-10-CM | POA: Diagnosis not present

## 2021-10-05 ENCOUNTER — Encounter: Payer: Self-pay | Admitting: Medical-Surgical

## 2021-10-09 DIAGNOSIS — R262 Difficulty in walking, not elsewhere classified: Secondary | ICD-10-CM | POA: Diagnosis not present

## 2021-10-09 DIAGNOSIS — M23361 Other meniscus derangements, other lateral meniscus, right knee: Secondary | ICD-10-CM | POA: Diagnosis not present

## 2021-10-09 DIAGNOSIS — M25561 Pain in right knee: Secondary | ICD-10-CM | POA: Diagnosis not present

## 2021-10-09 DIAGNOSIS — M6281 Muscle weakness (generalized): Secondary | ICD-10-CM | POA: Diagnosis not present

## 2021-10-12 DIAGNOSIS — M25561 Pain in right knee: Secondary | ICD-10-CM | POA: Diagnosis not present

## 2021-10-26 DIAGNOSIS — M25561 Pain in right knee: Secondary | ICD-10-CM | POA: Diagnosis not present

## 2021-10-30 DIAGNOSIS — M9907 Segmental and somatic dysfunction of upper extremity: Secondary | ICD-10-CM | POA: Diagnosis not present

## 2021-10-30 DIAGNOSIS — M9902 Segmental and somatic dysfunction of thoracic region: Secondary | ICD-10-CM | POA: Diagnosis not present

## 2021-10-30 DIAGNOSIS — M7542 Impingement syndrome of left shoulder: Secondary | ICD-10-CM | POA: Diagnosis not present

## 2021-10-30 DIAGNOSIS — M9901 Segmental and somatic dysfunction of cervical region: Secondary | ICD-10-CM | POA: Diagnosis not present

## 2021-11-06 DIAGNOSIS — M1711 Unilateral primary osteoarthritis, right knee: Secondary | ICD-10-CM | POA: Diagnosis not present

## 2021-12-30 ENCOUNTER — Other Ambulatory Visit: Payer: Self-pay

## 2021-12-30 ENCOUNTER — Encounter: Payer: Self-pay | Admitting: Emergency Medicine

## 2021-12-30 ENCOUNTER — Emergency Department (INDEPENDENT_AMBULATORY_CARE_PROVIDER_SITE_OTHER)
Admission: EM | Admit: 2021-12-30 | Discharge: 2021-12-30 | Disposition: A | Discharge status: Home / Self Care | Payer: BC Managed Care – PPO | Source: Home / Self Care | Attending: Family Medicine | Admitting: Family Medicine

## 2021-12-30 DIAGNOSIS — J22 Unspecified acute lower respiratory infection: Secondary | ICD-10-CM

## 2021-12-30 MED ORDER — PREDNISONE 20 MG PO TABS: 20.0000 mg | ORAL_TABLET | Freq: Two times a day (BID) | ORAL | 0 refills | Status: DC

## 2021-12-30 MED ORDER — AZITHROMYCIN 250 MG PO TABS: ORAL_TABLET | ORAL | 0 refills | Status: DC

## 2021-12-30 NOTE — ED Provider Notes (Signed)
?[facility] ? ? ? ?CSN: [id number] ?Arrival date & time: 12/30/21  1049 ? ? ?  ? ?History   ?Chief Complaint ?Chief Complaint  ?Patient presents with  ? Cough  ? ? ?HPI ?Rebecca Morrow is a 51 y.o. female.  ? ?HPI ? ?Patient's been sick for a week with cough cold runny nose and sore throat.  She has had negative home COVID test.  She states that she has a cough is worse at night.  Terrible coughing spells.  Cannot stop coughing.  No shortness of breath.  No chest pain.  Some fatigue.  No fever or chills sweats.  No known exposure to COVID or flu.  She has been using over-the-counter cough and cold medicines for temporary relief. ?No history of smoking.  No underlying lung disease ?Past Medical History:  ?Diagnosis Date  ? Arthritis   ? lt knee  ? Ganglion cyst of wrist   ? LT  ? Lateral epicondylitis of right elbow 01/19/2019  ? Left knee DJD   ? Shoulder pain with history of repair of rotator cuff and biceps tenodesis on 10-09-2018 01/19/2019  ? ? ?Patient Active Problem List  ? Diagnosis Date Noted  ? Abdominal pain 03/15/2021  ? Constipation 03/15/2021  ? Dysphagia 03/15/2021  ? Gastroesophageal reflux disease 03/15/2021  ? Personal history of colonic polyps 03/15/2021  ? Eosinophilic esophagitis 12/02/2019  ? Intolerance, food 12/02/2019  ? Left Shoulder pain with history of repair of rotator cuff and biceps tenodesis on 10-09-2018 01/19/2019  ? Ganglion cyst of dorsum of left wrist 03/19/2018  ? Routine general medical examination at a health care facility 10/12/2015  ? 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 ? ?Past Surgical History:  ?Procedure Laterality Date  ? GANGLION CYST EXCISION  08/27/2012  ? Procedure: REMOVAL GANGLION OF WRIST;  Surgeon: Wyn Forster., MD;  Location: [facility];  Service: Orthopedics;  Laterality: Left;  EXCISION BIOPSY LEFT WRIST DORSAL GANGLION CYST  ? JOINT REPLACEMENT    ? 7  knee procedures  ? KNEE ARTHROSCOPY    ? LT knee sx x6  ? SHOULDER CLOSED REDUCTION Left  01/25/2019  ? Procedure: CLOSED MANIPULATION  LEFT SHOULDER;  Surgeon: Salvatore Marvel, MD;  Location: [facility];  Service: Orthopedics;  Laterality: Left;  left  ? STERIOD INJECTION Right 01/25/2019  ? Procedure: CORTIZONE  INJECTION IN RIGHT ELBOW;  Surgeon: Salvatore Marvel, MD;  Location:  SURGERY CENTER;  Service: Orthopedics;  Laterality: Right;  right  ? TOTAL KNEE ARTHROPLASTY Left 03/29/2013  ? Dr Denton Ar  ? TOTAL KNEE ARTHROPLASTY Left 03/29/2013  ? Procedure: TOTAL KNEE ARTHROPLASTY;  Surgeon: Nilda Simmer, MD;  Location: [facility];  Service: Orthopedics;  Laterality: Left;  ? ? ?OB History   ?No obstetric history on file. ?  ? ? ? ?Home Medications   ? ?Prior to Admission medications   ?Medication Sig Start Date End Date Taking? Authorizing Provider  ?azithromycin (ZITHROMAX Z-PAK) 250 MG tablet Take two pills today followed by one a day until gone 12/30/21  Yes Eustace Moore, MD  ?predniSONE (DELTASONE) 20 MG tablet Take 1 tablet (20 mg total) by mouth 2 (two) times daily with a meal. 12/30/21  Yes Eustace Moore, MD  ?esomeprazole (NEXIUM) 20 MG capsule 1 capsule 07/29/16   [provider]  ? ? ?Family History ?Family History  ?Problem Relation Age of Onset  ? Heart attack Father 7  ? Hypertension  Father   ? Diabetes Mellitus II Mother   ? Arthritis Mother   ? Hypertension Maternal Grandmother   ? Colon cancer Paternal Grandmother   ? Allergic rhinitis Neg Hx   ? Angioedema Neg Hx   ? Asthma Neg Hx   ? Eczema Neg Hx   ? Immunodeficiency Neg Hx   ? Urticaria Neg Hx   ? ? ?Social History ?Social History  ? ?Tobacco Use  ? Smoking status: Never  ?  Passive exposure: Never  ? Smokeless tobacco: Never  ?Vaping Use  ? Vaping Use: Never used  ?Substance Use Topics  ? Alcohol use: Yes  ?  Alcohol/week: 3.0 standard drinks  ?  Types: 3 Cans of beer per week  ? Drug use: No  ? ? ? ?Allergies   ?Patient has no known allergies. ? ? ?Review of Systems ?Review of Systems ?See  HPI ? ?Physical Exam ?Triage Vital Signs ?ED Triage Vitals  ?Enc Vitals Group  ?   BP 12/30/21 1102 122/83  ?   Pulse Rate 12/30/21 1102 78  ?   Resp 12/30/21 1102 16  ?   Temp 12/30/21 1102 98.3 ?F (36.8 ?C)  ?   Temp Source 12/30/21 1102 Oral  ?   SpO2 12/30/21 1102 97 %  ?   Weight 12/30/21 1105 168 lb (76.2 kg)  ?   Height 12/30/21 1105 5' 7.5" (1.715 m)  ?   Head Circumference --   ?   Peak Flow --   ?   Pain Score 12/30/21 1104 3  ?   Pain Loc --   ?   Pain Edu? --   ?   Excl. in GC? --   ? ?No data found. ? ?Updated Vital Signs ?BP 122/83 (BP Location: Right Arm)   Pulse 78   Temp 98.3 ?F (36.8 ?C) (Oral)   Resp 16   Ht 5' 7.5" (1.715 m)   Wt 76.2 kg   LMP 12/22/2016   SpO2 97%   BMI 25.92 kg/m?  ?   ? ?Physical Exam ?Constitutional:   ?   General: She is not in acute distress. ?   Appearance: She is well-developed. She is ill-appearing.  ?HENT:  ?   Head: Normocephalic and atraumatic.  ?   Right Ear: Tympanic membrane and ear canal normal.  ?   Left Ear: Tympanic membrane and ear canal normal.  ?   Nose: Congestion and rhinorrhea present.  ?   Mouth/Throat:  ?   Pharynx: Posterior oropharyngeal erythema present.  ?Eyes:  ?   Conjunctiva/sclera: Conjunctivae normal.  ?   Pupils: Pupils are equal, round, and reactive to light.  ?Cardiovascular:  ?   Rate and Rhythm: Normal rate and regular rhythm.  ?   Heart sounds: Normal heart sounds.  ?Pulmonary:  ?   Effort: Pulmonary effort is normal. No respiratory distress.  ?   Breath sounds: Wheezing and rhonchi present.  ?   Comments: Patient has nasal congestion and red nasal membranes.  Mild erythema posterior pharynx.  Yellow PND.  She has cervical adenopathy.  On lung exam she has scattered wheeze and anterior rhonchi. ?Abdominal:  ?   General: There is no distension.  ?   Palpations: Abdomen is soft.  ?Musculoskeletal:     ?   General: Normal range of motion.  ?   Cervical back: Normal range of motion.  ?Lymphadenopathy:  ?   Cervical: Cervical  adenopathy present.  ?Skin: ?  General: Skin is warm and dry.  ?Neurological:  ?   Mental Status: She is alert.  ? ? ? ?UC Treatments / Results  ?Labs ?(all labs ordered are listed, but only abnormal results are displayed) ?Labs Reviewed - No data to display ? ?EKG ? ? ?Radiology ?No results found. ? ?Procedures ?Procedures (including critical care time) ? ?Medications Ordered in UC ?Medications - No data to display ? ?Initial Impression / Assessment and Plan / UC Course  ?I have reviewed the triage vital signs and the nursing notes. ? ?Pertinent labs & imaging results that were available during my care of the patient were reviewed by me and considered in my medical decision making (see chart for details). ? ?  ? ?Final Clinical Impressions(s) / UC Diagnoses  ? ?Final diagnoses:  ?LRTI (lower respiratory tract infection)  ? ? ? ?Discharge Instructions   ? ?  ?Drink lots of fluids ?Take over-the-counter cough and cold medicine as needed ?Take the Z-Pak as directed ?Take prednisone 40 mg a day for 5 days ?See your primary care doctor if not improving by next week ? ? ?ED Prescriptions   ? ? Medication Sig Dispense Auth. Provider  ? azithromycin (ZITHROMAX Z-PAK) 250 MG tablet Take two pills today followed by one a day until gone 6 tablet Eustace Moore, MD  ? predniSONE (DELTASONE) 20 MG tablet Take 1 tablet (20 mg total) by mouth 2 (two) times daily with a meal. 10 tablet Eustace Moore, MD  ? ?  ? ?PDMP not reviewed this encounter. ?  ?Eustace Moore, MD ?12/30/21 1148 ? ?

## 2021-12-30 NOTE — ED Triage Notes (Signed)
Started w/ sore throat 8 days ago  ?Cough started 6 days ago - worse at night  ?OTC - Tylenol cold & flu, sudafed sinus, delsym at night ?OTC COVID home test negative ?Delsym this am  ?COVID 04/2020 ?COVID vaccine x 2  + 3 boosters  ?

## 2021-12-30 NOTE — Discharge Instructions (Signed)
Drink lots of fluids ?Take over-the-counter cough and cold medicine as needed ?Take the Z-Pak as directed ?Take prednisone 40 mg a day for 5 days ?See your primary care doctor if not improving by next week ?

## 2022-01-22 DIAGNOSIS — M1711 Unilateral primary osteoarthritis, right knee: Secondary | ICD-10-CM | POA: Diagnosis not present

## 2022-01-29 DIAGNOSIS — M1711 Unilateral primary osteoarthritis, right knee: Secondary | ICD-10-CM | POA: Diagnosis not present

## 2022-01-30 DIAGNOSIS — D3131 Benign neoplasm of right choroid: Secondary | ICD-10-CM | POA: Diagnosis not present

## 2022-02-05 DIAGNOSIS — M1711 Unilateral primary osteoarthritis, right knee: Secondary | ICD-10-CM | POA: Diagnosis not present

## 2022-03-19 ENCOUNTER — Encounter: Payer: Self-pay | Admitting: Sports Medicine

## 2022-03-19 ENCOUNTER — Ambulatory Visit: Payer: BC Managed Care – PPO | Admitting: Sports Medicine

## 2022-03-19 DIAGNOSIS — M180 Bilateral primary osteoarthritis of first carpometacarpal joints: Secondary | ICD-10-CM | POA: Diagnosis not present

## 2022-03-19 DIAGNOSIS — B354 Tinea corporis: Secondary | ICD-10-CM | POA: Diagnosis not present

## 2022-03-19 DIAGNOSIS — M25519 Pain in unspecified shoulder: Secondary | ICD-10-CM

## 2022-03-19 DIAGNOSIS — Z9889 Other specified postprocedural states: Secondary | ICD-10-CM

## 2022-03-19 DIAGNOSIS — M7700 Medial epicondylitis, unspecified elbow: Secondary | ICD-10-CM | POA: Insufficient documentation

## 2022-03-19 DIAGNOSIS — M2550 Pain in unspecified joint: Secondary | ICD-10-CM | POA: Diagnosis not present

## 2022-03-19 DIAGNOSIS — M170 Bilateral primary osteoarthritis of knee: Secondary | ICD-10-CM

## 2022-03-19 MED ORDER — MELOXICAM 15 MG PO TABS: ORAL_TABLET | ORAL | 3 refills | Status: AC

## 2022-03-19 NOTE — Assessment & Plan Note (Signed)
Classic first CMC osteoarthritis, NSAIDs, x-rays, home conditioning. Injection if not better.

## 2022-03-19 NOTE — Assessment & Plan Note (Signed)
Bilateral golfers elbow, home conditioning as above. NSAIDs as above. Return to see me in 4 to 6 weeks for this.

## 2022-03-19 NOTE — Progress Notes (Signed)
    Procedures performed today:    None.  Independent interpretation of notes and tests performed by another provider:   None.  Brief History, Exam, Impression, and Recommendations:    Polyarthralgia Pleasant 51 year old female, multiple joint aches and pains. Significant morning stiffness. We will do a full rheumatoid work-up. Continue arthritis strength Tylenol twice daily to 3 times daily and adding meloxicam as well.  Primary osteoarthritis of both knees Bilateral knee osteoarthritis, post ACL tear and reconstruction on the left with a secondary osteoarthritis. Right knee he has had multiple scopes. Adding x-rays. NSAIDs as above. Home conditioning.  Primary osteoarthritis of both first carpometacarpal joints Classic first CMC osteoarthritis, NSAIDs, x-rays, home conditioning. Injection if not better.  Golfer's elbow Bilateral golfers elbow, home conditioning as above. NSAIDs as above. Return to see me in 4 to 6 weeks for this.  Left Shoulder pain with history of repair of rotator cuff and biceps tenodesis on 10-09-2018 Bilateral shoulder pain. Adding bilateral x-rays, cuff conditioning, NSAIDs, return to see me in 4 to 6 weeks for this.    ___________________________________________ Ihor Austin. Benjamin Stain, M.D., ABFM., CAQSM. Primary Care and Sports Medicine [facility]  Adjunct Instructor of Family Medicine  [employer]

## 2022-03-19 NOTE — Assessment & Plan Note (Signed)
Pleasant 51 year old female, multiple joint aches and pains. Significant morning stiffness. We will do a full rheumatoid work-up. Continue arthritis strength Tylenol twice daily to 3 times daily and adding meloxicam as well.

## 2022-03-19 NOTE — Assessment & Plan Note (Signed)
Bilateral knee osteoarthritis, post ACL tear and reconstruction on the left with a secondary osteoarthritis. Right knee he has had multiple scopes. Adding x-rays. NSAIDs as above. Home conditioning.

## 2022-03-19 NOTE — Assessment & Plan Note (Signed)
Bilateral shoulder pain. Adding bilateral x-rays, cuff conditioning, NSAIDs, return to see me in 4 to 6 weeks for this.

## 2022-03-21 ENCOUNTER — Ambulatory Visit (INDEPENDENT_AMBULATORY_CARE_PROVIDER_SITE_OTHER): Payer: BC Managed Care – PPO

## 2022-03-21 ENCOUNTER — Ambulatory Visit: Payer: BC Managed Care – PPO | Admitting: Sports Medicine

## 2022-03-21 DIAGNOSIS — M79671 Pain in right foot: Secondary | ICD-10-CM

## 2022-03-21 DIAGNOSIS — Z9889 Other specified postprocedural states: Secondary | ICD-10-CM | POA: Diagnosis not present

## 2022-03-21 DIAGNOSIS — G8918 Other acute postprocedural pain: Secondary | ICD-10-CM

## 2022-03-21 DIAGNOSIS — M2041 Other hammer toe(s) (acquired), right foot: Secondary | ICD-10-CM

## 2022-03-21 DIAGNOSIS — L905 Scar conditions and fibrosis of skin: Secondary | ICD-10-CM

## 2022-03-21 MED ORDER — AMOXICILLIN-POT CLAVULANATE 875-125 MG PO TABS: 1.0000 | ORAL_TABLET | Freq: Two times a day (BID) | ORAL | 0 refills | Status: DC

## 2022-03-21 NOTE — Progress Notes (Signed)
Subjective: Rebecca Morrow is a 51 y.o. female patient seen today in office for pain at right 2nd toe. Patient is S/P right 2nd hammertoe with 2nd MTPJ ligament repair and met osteotomy performed on 02/26/21. Patient reports that she has some pain/sensitivity at the tip of the toe with certain activities. No other issues noted.   Patient Active Problem List   Diagnosis Date Noted   Polyarthralgia 03/19/2022   Primary osteoarthritis of both knees 03/19/2022   Primary osteoarthritis of both first carpometacarpal joints 03/19/2022   Golfer's elbow 03/19/2022   Abdominal pain 03/15/2021   Constipation 03/15/2021   Dysphagia 03/15/2021   Gastroesophageal reflux disease 03/15/2021   Personal history of colonic polyps 54/27/0623   Eosinophilic esophagitis 76/28/3151   Intolerance, food 12/02/2019   Left Shoulder pain with history of repair of rotator cuff and biceps tenodesis on 10-09-2018 01/19/2019   Ganglion cyst of dorsum of left wrist 03/19/2018   Routine general medical examination at a health care facility 10/12/2015   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 30 tablet 3   METFORMIN HCL PO Take 500 mg by mouth in the morning and at bedtime.     predniSONE (DELTASONE) 20 MG tablet Take 1 tablet (20 mg total) by mouth 2 (two) times daily with a meal. 10 tablet 0   No current facility-administered medications on file prior to visit.    No Known Allergies  Objective: There were no vitals filed for this visit.  General: No acute distress, AAOx3  Right foot:Surgical incisions healed with minimal scar contracture at the level of the  second metatarsal phalangeal joint at the second toe, no erythema, no warmth, no drainage, no signs of infection noted at the 2nd toe, Capillary fill time <3 seconds in all digits, gross sensation present via light touch to right foot.+ pain at tip of 2nd toe. No pain with calf compression.   Xrays prominent screw head at the 2nd toe right foot  Assessment and Plan:  Problem List Items Addressed This Visit   None Visit Diagnoses     Post-op pain    -  Primary   Relevant Orders   DG Foot Complete Right   Hammertoe of right foot       Scar contracture       Status post surgery       Right foot pain          -Patient seen and evaluated -Xrays reviewed with prominent screw head at distal 2nd toe on right, all other hardware intact -Discussed pain at tip of right 2nd toe which is likely from painful hardware; Discussed in office removal of the hardware -Patient opt for surgical management. Consent obtained for hardware removal in office at right 2nd toe. Pre and Post op course explained. Risks, benefits, alternatives explained. No guarantees given or implied. Surgical booking slip submitted and provided patient with Surgical packet and info for office procedure. -Rx Augmentin for patient to start prior to procedure on Monday -Advised patient that she will need to return to her surgical shoe post  procedure for approximately 3 weeks until area is healed -Return as scheduled or sooner if problems or issues arise.  Landis Martins, DPM

## 2022-03-23 ENCOUNTER — Encounter: Payer: Self-pay | Admitting: Sports Medicine

## 2022-03-24 LAB — ANTI-NUCLEAR AB-TITER (ANA TITER)
ANA TITER: 1:40 {titer} — ABNORMAL HIGH
ANA Titer 1: 1:40 {titer} — ABNORMAL HIGH

## 2022-03-24 LAB — LUPUS(12) PANEL
Anti Nuclear Antibody (ANA): POSITIVE — AB
C3 Complement: 126 mg/dL (ref 83–193)
C4 Complement: 36 mg/dL (ref 15–57)
ENA SM Ab Ser-aCnc: <1 AI
Rheumatoid fact SerPl-aCnc: <14 IU/mL (ref <14)
Ribosomal P Protein Ab: <1 AI
SM/RNP: <1 AI
SSA (Ro) (ENA) Antibody, IgG: <1 AI
SSB (La) (ENA) Antibody, IgG: <1 AI
Scleroderma (Scl-70) (ENA) Antibody, IgG: <1 AI
Thyroperoxidase Ab SerPl-aCnc: <1 IU/mL (ref <9)
ds DNA Ab: <1 IU/mL

## 2022-03-24 LAB — SEDIMENTATION RATE: Sed Rate: 9 mm/h (ref 0–30)

## 2022-03-24 LAB — CBC WITH DIFFERENTIAL/PLATELET
Absolute Monocytes: 403 cells/uL (ref 200–950)
Basophils Absolute: 19 cells/uL (ref 0–200)
Basophils Relative: 0.3 %
Eosinophils Absolute: 183 cells/uL (ref 15–500)
Eosinophils Relative: 2.9 %
HCT: 41.8 % (ref 35.0–45.0)
Hemoglobin: 13.8 g/dL (ref 11.7–15.5)
Lymphs Abs: 2205 cells/uL (ref 850–3900)
MCH: 30.1 pg (ref 27.0–33.0)
MCHC: 33 g/dL (ref 32.0–36.0)
MCV: 91.1 fL (ref 80.0–100.0)
MPV: 11 fL (ref 7.5–12.5)
Monocytes Relative: 6.4 %
Neutro Abs: 3490 cells/uL (ref 1500–7800)
Neutrophils Relative %: 55.4 %
Platelets: 262 10*3/uL (ref 140–400)
RBC: 4.59 10*6/uL (ref 3.80–5.10)
RDW: 12.9 % (ref 11.0–15.0)
Total Lymphocyte: 35 %
WBC: 6.3 10*3/uL (ref 3.8–10.8)

## 2022-03-24 LAB — CYCLIC CITRUL PEPTIDE ANTIBODY, IGG: Cyclic Citrullin Peptide Ab: <16 UNITS

## 2022-03-24 LAB — RHEUMATOID FACTOR (IGA, IGG, IGM)
Rheumatoid Factor (IgA): <5 U (ref <=6)
Rheumatoid Factor (IgG): <5 U (ref <=6)
Rheumatoid Factor (IgM): <5 U (ref <=6)

## 2022-03-24 LAB — COMPREHENSIVE METABOLIC PANEL
AG Ratio: 1.7 (calc) (ref 1.0–2.5)
ALT: 15 U/L (ref 6–29)
AST: 22 U/L (ref 10–35)
Albumin: 4.6 g/dL (ref 3.6–5.1)
Alkaline phosphatase (APISO): 63 U/L (ref 37–153)
BUN: 16 mg/dL (ref 7–25)
CO2: 28 mmol/L (ref 20–32)
Calcium: 10 mg/dL (ref 8.6–10.4)
Chloride: 106 mmol/L (ref 98–110)
Creat: 0.83 mg/dL (ref 0.50–1.03)
Globulin: 2.7 g/dL (calc) (ref 1.9–3.7)
Glucose, Bld: 87 mg/dL (ref 65–99)
Potassium: 4.6 mmol/L (ref 3.5–5.3)
Sodium: 141 mmol/L (ref 135–146)
Total Bilirubin: 0.3 mg/dL (ref 0.2–1.2)
Total Protein: 7.3 g/dL (ref 6.1–8.1)

## 2022-03-24 LAB — CK: Total CK: 134 U/L (ref 29–143)

## 2022-03-24 LAB — URIC ACID: Uric Acid, Serum: 4.4 mg/dL (ref 2.5–7.0)

## 2022-03-26 ENCOUNTER — Telehealth: Payer: Self-pay | Admitting: Urology

## 2022-03-26 NOTE — Telephone Encounter (Signed)
OFFICE DOS - 03/28/22  REMOVAL FIXATION DEEP RIGHT --- 20680  [employer] EFFECTIVE DATE - 03/26/22   PLAN DEDUCTIBLE - $250.00 W/ $0.00 REMAINING OUT OF POCKET - $2,000.00 W/ $1,376.00 REMAINING COINSURANCE - 10% COPAY - $0.00   SPOKE WITH AIRA B. WITH [employer] AND SHE STATED THAT FOR CPT CODE 85277 NO PRIOR AUTH IS REQUIRED.  REF # LVP - [id number]

## 2022-03-28 ENCOUNTER — Ambulatory Visit: Payer: BC Managed Care – PPO | Admitting: Sports Medicine

## 2022-03-28 ENCOUNTER — Encounter: Payer: Self-pay | Admitting: Sports Medicine

## 2022-03-28 DIAGNOSIS — T8484XA Pain due to internal orthopedic prosthetic devices, implants and grafts, initial encounter: Secondary | ICD-10-CM

## 2022-03-28 DIAGNOSIS — M79674 Pain in right toe(s): Secondary | ICD-10-CM

## 2022-03-28 MED ORDER — OXYCODONE-ACETAMINOPHEN 5-325 MG PO TABS: 1.0000 | ORAL_TABLET | Freq: Four times a day (QID) | ORAL | 0 refills | Status: AC | PRN

## 2022-03-28 MED ORDER — AMOXICILLIN-POT CLAVULANATE 875-125 MG PO TABS: 1.0000 | ORAL_TABLET | Freq: Two times a day (BID) | ORAL | 0 refills | Status: DC

## 2022-03-28 MED ORDER — HYDROCODONE-ACETAMINOPHEN 5-325 MG PO TABS: 1.0000 | ORAL_TABLET | Freq: Four times a day (QID) | ORAL | 0 refills | Status: AC | PRN

## 2022-03-28 NOTE — Addendum Note (Signed)
Addended by: Asencion Islam T on: 03/28/2022 09:07 AM   Modules accepted: Orders

## 2022-03-28 NOTE — Progress Notes (Signed)
DATE OF OPERATION: 03-28-22  PREOPERATIVE DIAGNOSES:  1. Painful hardware at right 2nd toe   POSTOPERATIVE DIAGNOSIS:  Same  OPERATION PERFORMED:  1. Removal of painful hardware/screw/deep implant at right 2nd toe   SURGEON: Landis Martins, DPM   ASSISTANT: None  ESTIMATED BLOOD LOSS:  5cc  HEMOSTASIS:  Toe touniquet on right    INJECTABLES:  Pre-procedure 35mL of 1% lidocaine plain and 0.25% Marcaine plain   INDICATIONS FOR OPERATION:  The patient is a 51 y.o. female with clinical and radiographic signs and symptoms consistent with the above-stated diagnosis.The patient has chosen surgical management of the diagnosis and has consented for surgery/procedure. All risks, benefits and complications have been explained to the patient.The patient understands. No guarantees were made or implied to the outcome of the surgery.   DESCRIPTION OF OPERATION:  The patient was brought into the in office operating room and positioned on the table in the supine position. Following appropriate padding of all bony areas, local anesthesia was administered using injectable as above and toe tourniquet was placed at right 2nd toe but not yet inflated. Following the foot was then prepped and draped.   Attention was directed towards the right 2nd toe where anesthesia was confirmed and then tourniquet was placed, then using a 15 blade a  longitudinal incision was made centered over the distal tuft of the 2nd toe.  The incision was deepened through skin and subcutaneous tissue with care to protect and retract all vital neurovascular structures.  Once to this level the screw head was palpated and freed using a pick up and then using Integra 3.0 screw driver the screw was removed. The area was flushed with betadine solution. The skin and subcutaneous tissue over the incision area was coapted using 4-0 nylon in a simple interrupted suture fashion. A dry sterile dressing was applied and the tourniquet was rapidly  deflated/removed and prompt instantaneous hyperemic response was noted to all digits.   The patient tolerated the procedure and local anesthesia well and was transported from the operating room to the recovery with vital signs stable and vascular status intact to all aspects of the patient's left and right lower extremity.  Following a period of postoperative monitoring, the patient was discharged home with oral and written instructions per Dr. Cannon Kettle. The patient was given postop prescriptions. The patient will weightbear as tolerated with open surgical shoe or slide. Patient will take Augmentin as Rx'd and Norco as needed for pain. Patient to follow up in 1 week for continued post op care/dressing change and then in 2 weeks for post op check and xrays s/p removal of hardware at right 2nd toe. Patient was instructed to call office if there are any ?s or concerns.  Landis Martins, DPM

## 2022-04-05 ENCOUNTER — Ambulatory Visit (INDEPENDENT_AMBULATORY_CARE_PROVIDER_SITE_OTHER): Payer: BC Managed Care – PPO | Admitting: Podiatry

## 2022-04-05 ENCOUNTER — Encounter: Payer: Self-pay | Admitting: Podiatry

## 2022-04-05 ENCOUNTER — Ambulatory Visit (INDEPENDENT_AMBULATORY_CARE_PROVIDER_SITE_OTHER): Payer: BC Managed Care – PPO

## 2022-04-05 DIAGNOSIS — Z9889 Other specified postprocedural states: Secondary | ICD-10-CM

## 2022-04-05 DIAGNOSIS — M7731 Calcaneal spur, right foot: Secondary | ICD-10-CM | POA: Diagnosis not present

## 2022-04-05 DIAGNOSIS — T8484XA Pain due to internal orthopedic prosthetic devices, implants and grafts, initial encounter: Secondary | ICD-10-CM

## 2022-04-05 DIAGNOSIS — M79671 Pain in right foot: Secondary | ICD-10-CM | POA: Diagnosis not present

## 2022-04-05 NOTE — Progress Notes (Signed)
  Subjective:  Patient ID: Rebecca Morrow, female    DOB: [birth year],  MRN: [id number]  Chief Complaint  Patient presents with   Routine Post Op    The 2nd toe on the right is feeling better and once the pin came out it was a relief and I am ready for the stitches to come out and I do have a regular shoe on    DOS: 03/28/22 Procedure: Right second toe removal of painful hardware.   51 y.o. female returns for POV#1. Relates it is doing well and not having any pain. Feels much better prior to surgery   Review of Systems: Negative except as noted in the HPI. Denies N/V/F/Ch.  Past Medical History:  Diagnosis Date   Arthritis    lt knee   Ganglion cyst of wrist    LT   Lateral epicondylitis of right elbow 01/19/2019   Left knee DJD    Shoulder pain with history of repair of rotator cuff and biceps tenodesis on 10-09-2018 01/19/2019    Current Outpatient Medications:    esomeprazole (NEXIUM) 20 MG capsule, 1 capsule, Disp: , Rfl:    meloxicam (MOBIC) 15 MG tablet, One tab PO every 24 hours with a meal for 2 weeks, then once every 24 hours prn pain., Disp: 30 tablet, Rfl: 3   METFORMIN HCL PO, Take 500 mg by mouth in the morning and at bedtime., Disp: , Rfl:    predniSONE (DELTASONE) 20 MG tablet, Take 1 tablet (20 mg total) by mouth 2 (two) times daily with a meal., Disp: 10 tablet, Rfl: 0  Social History   Tobacco Use  Smoking Status Never   Passive exposure: Never  Smokeless Tobacco Never    No Known Allergies Objective:  There were no vitals filed for this visit. There is no height or weight on file to calculate BMI. Constitutional Well developed. Well nourished.  Vascular Foot warm and well perfused. Capillary refill normal to all digits.   Neurologic Normal speech. Oriented to person, place, and time. Epicritic sensation to light touch grossly present bilaterally.  Dermatologic Skin healing well without signs of infection. Skin edges well coapted without signs of  infection.  Orthopedic: Tenderness to palpation noted about the surgical site.   Radiographs: Interval removal of right second digit screw.  Assessment:   1. Painful orthopaedic hardware (HCC)   2. Status post surgery    Plan:  Patient was evaluated and treated and all questions answered.  S/p foot surgery right -Progressing as expected post-operatively. -WB Status: WBAT in regular shoe -Sutures: intact. -Medications: n/a -Foot redressed. Will follow-up in one week for suture removal.   No follow-ups on file.

## 2022-04-09 DIAGNOSIS — K200 Eosinophilic esophagitis: Secondary | ICD-10-CM | POA: Diagnosis not present

## 2022-04-09 DIAGNOSIS — R1310 Dysphagia, unspecified: Secondary | ICD-10-CM | POA: Diagnosis not present

## 2022-04-09 DIAGNOSIS — K219 Gastro-esophageal reflux disease without esophagitis: Secondary | ICD-10-CM | POA: Diagnosis not present

## 2022-04-10 ENCOUNTER — Encounter: Payer: BC Managed Care – PPO | Admitting: Podiatry

## 2022-04-11 ENCOUNTER — Encounter: Payer: Self-pay | Admitting: Podiatry

## 2022-04-11 ENCOUNTER — Ambulatory Visit (INDEPENDENT_AMBULATORY_CARE_PROVIDER_SITE_OTHER): Payer: BC Managed Care – PPO | Admitting: Podiatry

## 2022-04-11 DIAGNOSIS — Z9889 Other specified postprocedural states: Secondary | ICD-10-CM

## 2022-04-11 DIAGNOSIS — T81509A Unspecified complication of foreign body accidentally left in body following unspecified procedure, initial encounter: Secondary | ICD-10-CM

## 2022-04-11 DIAGNOSIS — T8484XA Pain due to internal orthopedic prosthetic devices, implants and grafts, initial encounter: Secondary | ICD-10-CM

## 2022-04-11 NOTE — Progress Notes (Signed)
  Subjective:  Patient ID: Rebecca Morrow, female    DOB: 12/11/1970,  MRN: [id number]  Chief Complaint  Patient presents with   Routine Post Op    POV #1 DOS 03/28/2022 REMOVAL OF HARDWARE AT RT 2ND TOE    DOS: 03/28/22 Procedure: Right second toe removal of painful hardware.   51 y.o. female returns for POV#2. Relates it is doing well and not having any pain. Feels much better prior to surgery   Review of Systems: Negative except as noted in the HPI. Denies N/V/F/Ch.  Past Medical History:  Diagnosis Date   Arthritis    lt knee   Ganglion cyst of wrist    LT   Lateral epicondylitis of right elbow 01/19/2019   Left knee DJD    Shoulder pain with history of repair of rotator cuff and biceps tenodesis on 10-09-2018 01/19/2019    Current Outpatient Medications:    esomeprazole (NEXIUM) 20 MG capsule, 1 capsule, Disp: , Rfl:    meloxicam (MOBIC) 15 MG tablet, One tab PO every 24 hours with a meal for 2 weeks, then once every 24 hours prn pain., Disp: 30 tablet, Rfl: 3   METFORMIN HCL PO, Take 500 mg by mouth in the morning and at bedtime., Disp: , Rfl:    predniSONE (DELTASONE) 20 MG tablet, Take 1 tablet (20 mg total) by mouth 2 (two) times daily with a meal., Disp: 10 tablet, Rfl: 0  Social History   Tobacco Use  Smoking Status Never   Passive exposure: Never  Smokeless Tobacco Never    No Known Allergies Objective:  There were no vitals filed for this visit. There is no height or weight on file to calculate BMI. Constitutional Well developed. Well nourished.  Vascular Foot warm and well perfused. Capillary refill normal to all digits.   Neurologic Normal speech. Oriented to person, place, and time. Epicritic sensation to light touch grossly present bilaterally.  Dermatologic Skin healing well without signs of infection. Skin edges well coapted without signs of infection.  Orthopedic: Tenderness to palpation noted about the surgical site.   Radiographs: Interval removal  of right second digit screw.  Assessment:   1. Painful orthopaedic hardware (HCC)   2. Status post surgery    Plan:  Patient was evaluated and treated and all questions answered.  S/p foot surgery right -Progressing as expected post-operatively. -WB Status: WBAT in regular shoe -Sutures: removed without incident.  -Medications: n/a -Foot redressed. Patient discharged at this point and will follow-up as needed in the future for any issues.   Return if symptoms worsen or fail to improve.

## 2022-05-06 ENCOUNTER — Ambulatory Visit: Payer: BC Managed Care – PPO | Admitting: Sports Medicine

## 2022-05-06 DIAGNOSIS — M2550 Pain in unspecified joint: Secondary | ICD-10-CM | POA: Diagnosis not present

## 2022-05-06 DIAGNOSIS — M180 Bilateral primary osteoarthritis of first carpometacarpal joints: Secondary | ICD-10-CM | POA: Diagnosis not present

## 2022-05-06 DIAGNOSIS — M170 Bilateral primary osteoarthritis of knee: Secondary | ICD-10-CM

## 2022-05-06 DIAGNOSIS — M7700 Medial epicondylitis, unspecified elbow: Secondary | ICD-10-CM | POA: Diagnosis not present

## 2022-05-06 DIAGNOSIS — M25519 Pain in unspecified shoulder: Secondary | ICD-10-CM

## 2022-05-06 DIAGNOSIS — Z9889 Other specified postprocedural states: Secondary | ICD-10-CM

## 2022-05-06 NOTE — Assessment & Plan Note (Signed)
Bilateral knee osteoarthritis, ACL tear with reconstruction on the left with secondary osteoarthritis, right knee with multiple scopes, pain resolved with meloxicam and home conditioning.

## 2022-05-06 NOTE — Assessment & Plan Note (Signed)
Bilateral medial epicondylitis resolved with meloxicam. Return as needed

## 2022-05-06 NOTE — Progress Notes (Signed)
    Procedures performed today:    None.  Independent interpretation of notes and tests performed by another provider:   None.  Brief History, Exam, Impression, and Recommendations:    Golfer's elbow Bilateral medial epicondylitis resolved with meloxicam. Return as needed  Left Shoulder pain with history of repair of rotator cuff and biceps tenodesis on 10-09-2018 Resolved with meloxicam and conditioning  Polyarthralgia Rheumatoid testing overall negative.  Primary osteoarthritis of both first carpometacarpal joints Resolved with meloxicam and home conditioning.  Primary osteoarthritis of both knees Bilateral knee osteoarthritis, ACL tear with reconstruction on the left with secondary osteoarthritis, right knee with multiple scopes, pain resolved with meloxicam and home conditioning.    ____________________________________________ Ihor Austin. Benjamin Stain, M.D., ABFM., CAQSM., AME. Primary Care and Sports Medicine [facility]  Adjunct Professor of Family Medicine  [employer]  Restaurant manager, [employer]

## 2022-05-06 NOTE — Assessment & Plan Note (Signed)
Rheumatoid testing overall negative.

## 2022-05-06 NOTE — Assessment & Plan Note (Signed)
Resolved with meloxicam and conditioning

## 2022-05-06 NOTE — Assessment & Plan Note (Signed)
Resolved with meloxicam and home conditioning.

## 2022-05-24 DIAGNOSIS — K219 Gastro-esophageal reflux disease without esophagitis: Secondary | ICD-10-CM | POA: Diagnosis not present

## 2022-05-24 DIAGNOSIS — R1310 Dysphagia, unspecified: Secondary | ICD-10-CM | POA: Diagnosis not present

## 2022-06-23 ENCOUNTER — Encounter: Payer: Self-pay | Admitting: Medical-Surgical

## 2022-09-02 ENCOUNTER — Ambulatory Visit: Payer: Self-pay | Admitting: Licensed Clinical Social Worker

## 2022-09-02 NOTE — Patient Instructions (Signed)
Visit Information  Thank you for taking time to visit with me today. Please don't hesitate to contact me if I can be of assistance to you before our next scheduled telephone appointment.  Following are the goals we discussed today:   No further  intervention is needed at this time  Please call the care guide team at [phone] if you need to cancel or reschedule your appointment.   If you are experiencing a Mental Health or [place] or need someone to talk to, please go to [facility] [phone])   Following is a copy of your full plan of care:   Interventions Informed  client of program support services Client said she moved to [place] in July of 2023 and has established medical care with providers in [place]. She did not have any care needs at this time Declined program services  Ms. Dahle was given information about Care Management services by the embedded care coordination team including:  Care Management services include personalized support from designated clinical staff supervised by her physician, including individualized plan of care and coordination with other care providers 24/7 contact phone numbers for assistance for urgent and routine care needs. The patient may stop CCM services at any time (effective at the end of the month) by phone call to the office staff.  Patient agreed to services and verbal consent obtained.   Norva Riffle.Alleyne Lac MSW, [place] [employer] representative [facility] Care Management [phone]

## 2022-09-02 NOTE — Patient Outreach (Signed)
  Care Coordination   Initial Visit Note   09/02/2022 Name: BLOSSIE RAFFEL MRN: [id number] DOB: May 04, 1971  VIANEY CANIGLIA is a 51 y.o. year old female who sees Samuel Bouche, NP for primary care. I spoke with  Cresenciano Lick by phone today.  What matters to the patients health and wellness today? No current care needs . Declined program services    Goals Addressed               This Visit's Progress     Patient Stated she did not have any care needs at present (pt-stated)        Interventions Informed  client of program support services Client said she moved to [place] in July of 2023 and has established medical care with providers in [place]. She did not have any care needs at this time Declined program services     SDOH assessments and interventions completed:  Yes  SDOH Interventions Today    Flowsheet Row Most Recent Value  SDOH Interventions   Physical Activity Interventions Other (Comments)  [may have some walking challenges]        Care Coordination Interventions Activated:  Yes  Care Coordination Interventions:  Yes, provided   Follow up plan: No further intervention required.   Encounter Outcome:  Pt. Visit Completed   Norva Riffle.Drina Jobst MSW, [place] [employer] representative [facility] Care Management [phone]

## 2024-06-29 ENCOUNTER — Encounter: Payer: Self-pay | Admitting: Sports Medicine
